# Patient Record
Sex: Male | Born: 1955
Health system: Southern US, Community
[De-identification: ages and names within clinical notes are randomized; demographics above are authoritative.]

## PROBLEM LIST (undated history)

## (undated) DIAGNOSIS — N3289 Other specified disorders of bladder: Secondary | ICD-10-CM

## (undated) DIAGNOSIS — M1990 Unspecified osteoarthritis, unspecified site: Secondary | ICD-10-CM

## (undated) DIAGNOSIS — R7303 Prediabetes: Secondary | ICD-10-CM

## (undated) HISTORY — PX: ROTATOR CUFF REPAIR: SHX139

## (undated) HISTORY — PX: REPLACEMENT TOTAL KNEE BILATERAL: SUR1225

---

## 1998-11-14 HISTORY — PX: HERNIA REPAIR: SHX51

## 2003-01-14 ENCOUNTER — Encounter: Payer: Self-pay | Admitting: Neurosurgery

## 2003-01-15 ENCOUNTER — Encounter: Payer: Self-pay | Admitting: Neurosurgery

## 2003-01-15 ENCOUNTER — Ambulatory Visit (HOSPITAL_COMMUNITY): Admission: RE | Admit: 2003-01-15 | Discharge: 2003-01-16 | Payer: Self-pay | Admitting: Neurosurgery

## 2009-11-14 HISTORY — PX: CERVICAL SPINE SURGERY: SHX589

## 2013-12-30 ENCOUNTER — Ambulatory Visit (INDEPENDENT_AMBULATORY_CARE_PROVIDER_SITE_OTHER): Payer: BC Managed Care – PPO

## 2013-12-30 ENCOUNTER — Encounter (INDEPENDENT_AMBULATORY_CARE_PROVIDER_SITE_OTHER): Payer: Self-pay

## 2013-12-30 ENCOUNTER — Encounter: Payer: Self-pay | Admitting: Family Medicine

## 2013-12-30 ENCOUNTER — Ambulatory Visit (INDEPENDENT_AMBULATORY_CARE_PROVIDER_SITE_OTHER): Payer: BC Managed Care – PPO | Admitting: Family Medicine

## 2013-12-30 VITALS — BP 137/81 | HR 75 | Temp 101.2°F | Wt 248.6 lb

## 2013-12-30 DIAGNOSIS — J09X2 Influenza due to identified novel influenza A virus with other respiratory manifestations: Secondary | ICD-10-CM

## 2013-12-30 DIAGNOSIS — R509 Fever, unspecified: Secondary | ICD-10-CM

## 2013-12-30 DIAGNOSIS — J209 Acute bronchitis, unspecified: Secondary | ICD-10-CM

## 2013-12-30 DIAGNOSIS — R059 Cough, unspecified: Secondary | ICD-10-CM

## 2013-12-30 DIAGNOSIS — R05 Cough: Secondary | ICD-10-CM

## 2013-12-30 LAB — POCT INFLUENZA A/B
Influenza A, POC: POSITIVE
Influenza B, POC: NEGATIVE

## 2013-12-30 MED ORDER — AZITHROMYCIN 250 MG PO TABS: ORAL_TABLET | ORAL | Status: DC

## 2013-12-30 MED ORDER — BENZONATATE 200 MG PO CAPS: 200.0000 mg | ORAL_CAPSULE | Freq: Three times a day (TID) | ORAL | Status: DC | PRN

## 2013-12-30 MED ORDER — OSELTAMIVIR PHOSPHATE 75 MG PO CAPS: 75.0000 mg | ORAL_CAPSULE | Freq: Two times a day (BID) | ORAL | Status: DC

## 2013-12-30 NOTE — Patient Instructions (Signed)
Acute Bronchitis Bronchitis is inflammation of the airways that extend from the windpipe into the lungs (bronchi). The inflammation often causes mucus to develop. This leads to a cough, which is the most common symptom of bronchitis.  In acute bronchitis, the condition usually develops suddenly and goes away over time, usually in a couple weeks. Smoking, allergies, and asthma can make bronchitis worse. Repeated episodes of bronchitis may cause further lung problems.  CAUSES Acute bronchitis is most often caused by the same virus that causes a cold. The virus can spread from person to person (contagious).  SIGNS AND SYMPTOMS   Cough.   Fever.   Coughing up mucus.   Body aches.   Chest congestion.   Chills.   Shortness of breath.   Sore throat.  DIAGNOSIS  Acute bronchitis is usually diagnosed through a physical exam. Tests, such as chest X-rays, are sometimes done to rule out other conditions.  TREATMENT  Acute bronchitis usually goes away in a couple weeks. Often times, no medical treatment is necessary. Medicines are sometimes given for relief of fever or cough. Antibiotics are usually not needed but may be prescribed in certain situations. In some cases, an inhaler may be recommended to help reduce shortness of breath and control the cough. A cool mist vaporizer may also be used to help thin bronchial secretions and make it easier to clear the chest.  HOME CARE INSTRUCTIONS  Get plenty of rest.   Drink enough fluids to keep your urine clear or pale yellow (unless you have a medical condition that requires fluid restriction). Increasing fluids may help thin your secretions and will prevent dehydration.   Only take over-the-counter or prescription medicines as directed by your health care provider.   Avoid smoking and secondhand smoke. Exposure to cigarette smoke or irritating chemicals will make bronchitis worse. If you are a smoker, consider using nicotine gum or skin  patches to help control withdrawal symptoms. Quitting smoking will help your lungs heal faster.   Reduce the chances of another bout of acute bronchitis by washing your hands frequently, avoiding people with cold symptoms, and trying not to touch your hands to your mouth, nose, or eyes.   Follow up with your health care provider as directed.  SEEK MEDICAL CARE IF: Your symptoms do not improve after 1 week of treatment.  SEEK IMMEDIATE MEDICAL CARE IF:  You develop an increased fever or chills.   You have chest pain.   You have severe shortness of breath.  You have bloody sputum.   You develop dehydration.  You develop fainting.  You develop repeated vomiting.  You develop a severe headache. MAKE SURE YOU:   Understand these instructions.  Will watch your condition.  Will get help right away if you are not doing well or get worse. Document Released: 12/08/2004 Document Revised: 07/03/2013 Document Reviewed: 04/23/2013 [facility] Patient Information 2014 [place].   Influenza A (H1N1) H1N1 formerly called "swine flu" is a new influenza virus causing sickness in people. The H1N1 virus is different from seasonal influenza viruses. However, the H1N1 symptoms are similar to seasonal influenza and it is spread from person to person. You may be at higher risk for serious problems if you have underlying serious medical conditions. The CDC and the [facility] are following reported cases around the world. CAUSES   The flu is thought to spread mainly person-to-person through coughing or sneezing of infected people.  A person may become infected by touching something with the  virus on it and then touching their mouth or nose. SYMPTOMS   Fever.  Headache.  Tiredness.  Cough.  Sore throat.  Runny or stuffy nose.  Body aches.  Diarrhea and vomiting These symptoms are referred to as "flu-like symptoms." A lot of different illnesses, including the  common cold, may have similar symptoms. DIAGNOSIS   There are tests that can tell if you have the H1N1 virus.  Confirmed cases of H1N1 will be reported to the state or local health department.  A doctor's exam may be needed to tell whether you have an infection that is a complication of the flu. HOME CARE INSTRUCTIONS   Stay informed. Visit the [facility] website for current recommendations. Visit [employer]. You may also call 1-800-CDC-INFO 484 069 0136).  Get help early if you develop any of the above symptoms.  If you are at high risk from complications of the flu, talk to your caregiver as soon as you develop flu-like symptoms. Those at higher risk for complications include:  People 65 years or older.  People with chronic medical conditions.  Pregnant women.  Young children.  Your caregiver may recommend antiviral medicine to help treat the flu.  If you get the flu, get plenty of rest, drink enough water and fluids to keep your urine clear or pale yellow, and avoid using alcohol or tobacco.  You may take over-the-counter medicine to relieve the symptoms of the flu if your caregiver approves. (Never give aspirin to children or teenagers who have flu-like symptoms, particularly fever). TREATMENT  If you do get sick, antiviral drugs are available. These drugs can make your illness milder and make you feel better faster. Treatment should start soon after illness starts. It is only effective if taken within the first day of becoming ill. Only your caregiver can prescribe antiviral medication.  PREVENTION   Cover your nose and mouth with a tissue or your arm when you cough or sneeze. Throw the tissue away.  Wash your hands often with soap and warm water, especially after you cough or sneeze. Alcohol-based cleaners are also effective against germs.  Avoid touching your eyes, nose or mouth. This is one way germs spread.  Try to avoid contact with sick people. Follow public  health advice regarding school closures. Avoid crowds.  Stay home if you get sick. Limit contact with others to keep from infecting them. People infected with the H1N1 virus may be able to infect others anywhere from 1 day before feeling sick to 5-7 days after getting flu symptoms.  An H1N1 vaccine is available to help protect against the virus. In addition to the H1N1 vaccine, you will need to be vaccinated for seasonal influenza. The H1N1 and seasonal vaccines may be given on the same day. The CDC especially recommends the H1N1 vaccine for:  Pregnant women.  People who live with or care for children younger than 35 months of age.  Health care and emergency services personnel.  Persons between the ages of 53 months through 44 years of age.  People from ages 37 through 55 years who are at higher risk for H1N1 because of chronic health disorders or immune system problems. FACEMASKS In community and home settings, the use of facemasks and N95 respirators are not normally recommended. In certain circumstances, a facemask or N95 respirator may be used for persons at increased risk of severe illness from influenza. Your caregiver can give additional recommendations for facemask use. IN CHILDREN, EMERGENCY WARNING SIGNS THAT NEED URGENT MEDICAL CARE:  Fast breathing or trouble breathing.  Bluish skin color.  Not drinking enough fluids.  Not waking up or not interacting normally.  Being so fussy that the child does not want to be held.  Your child has an oral temperature above 102 F (38.9 C), not controlled by medicine.  Your baby is older than 3 months with a rectal temperature of 102 F (38.9 C) or higher.  Your baby is 85 months old or younger with a rectal temperature of 100.4 F (38 C) or higher.  Flu-like symptoms improve but then return with fever and worse cough. IN ADULTS, EMERGENCY WARNING SIGNS THAT NEED URGENT MEDICAL CARE:  Difficulty breathing or shortness of  breath.  Pain or pressure in the chest or abdomen.  Sudden dizziness.  Confusion.  Severe or persistent vomiting.  Bluish color.  You have a oral temperature above 102 F (38.9 C), not controlled by medicine.  Flu-like symptoms improve but return with fever and worse cough. SEEK IMMEDIATE MEDICAL CARE IF:  You or someone you know is experiencing any of the above symptoms. When you arrive at the emergency center, report that you think you have the flu. You may be asked to wear a mask and/or sit in a secluded area to protect others from getting sick. MAKE SURE YOU:   Understand these instructions.  Will watch your condition.  Will get help right away if you are not doing well or get worse. Some of this information courtesy of the CDC.  Document Released: 04/18/2008 Document Revised: 01/23/2012 Document Reviewed: 04/18/2008 [facility] Patient Information 2014 [place], [place].        Dr Paula Libra Recommendations  For nutrition information, I recommend books:  1).Eat to Live by Dr Excell Seltzer. 2).Prevent and Reverse Heart Disease by Dr Karl Luke. 3) Dr Janene Harvey Book:  Program to Reverse Diabetes  Exercise recommendations are:  If unable to walk, then the patient can exercise in a chair 3 times a day. By flapping arms like a bird gently and raising legs outwards to the front.  If ambulatory, the patient can go for walks for 30 minutes 3 times a week. Then increase the intensity and duration as tolerated.  Goal is to try to attain exercise frequency to 5 times a week.  If applicable: Best to perform resistance exercises (machines or weights) 2 days a week and cardio type exercises 3 days per week.

## 2013-12-30 NOTE — Progress Notes (Signed)
Patient ID: Marvin Clarke, male   DOB: [birth year], 58 y.o.   MRN: [id number] SUBJECTIVE: CC: Chief Complaint  Patient presents with  . Acute Visit    cough fever had knee arthroscopy left knee on friday at [facility]     HPI: Had arthroscopy last Friday 3 days ago. Started having fever. When he coughs it hurts on the left side of the chest of the chest. Wheezes : some. Whenever he gets sick it goes to his chest. No past medical history on file. No past surgical history on file. History   Social History  . Marital Status: Married    Spouse Name: N/A    Number of Children: N/A  . Years of Education: N/A   Occupational History  . Not on file.   Social History Main Topics  . Smoking status: Never Smoker   . Smokeless tobacco: Not on file  . Alcohol Use: Not on file  . Drug Use: Not on file  . Sexual Activity: Not on file   Other Topics Concern  . Not on file   Social History Narrative  . No narrative on file   No family history on file. No current outpatient prescriptions on file prior to visit.   No current facility-administered medications on file prior to visit.   No Known Allergies  There is no immunization history on file for this patient. Prior to Admission medications   Not on File     ROS: As above in the HPI. All other systems are stable or negative.  OBJECTIVE: APPEARANCE:  Patient in no acute distress.The patient appeared well nourished and normally developed. Acyanotic. Waist: VITAL SIGNS:BP 137/81  Pulse 75  Temp(Src) 101.2 F (38.4 C) (Oral)  Wt 248 lb 9.6 oz (112.764 kg)  SpO2 96% Obese WM   SKIN: warm and  Dry without overt rashes, tattoos and scars  HEAD and Neck: without JVD, Head and scalp: normal Eyes:No scleral icterus. Fundi normal, eye movements normal. Ears: Auricle normal, canal normal, Tympanic membranes normal, insufflation normal. Nose: nasal congestion. Profuse rhinorrhea Throat: red Neck & thyroid: normal  CHEST &  LUNGS: Chest wall: normal Lungs: Rhonchi bilaterally. No wheezes. No rales.  CVS: Reveals the PMI to be normally located. Regular rhythm, First and Second Heart sounds are normal,  absence of murmurs, rubs or gallops. Peripheral vasculature: Radial pulses: normal Dorsal pedis pulses: normal Posterior pulses: normal  ABDOMEN:  Appearance: Obese Benign, no organomegaly, no masses, no Abdominal Aortic enlargement. No Guarding , no rebound. No Bruits. Bowel sounds: normal  RECTAL: N/A GU: N/A  EXTREMETIES: nonedematous.  MUSCULOSKELETAL:  Spine: normal Joints: left knee post arthroscopy  NEUROLOGIC: oriented to time,place and person; nonfocal. Cranial Nerves are normal.  ASSESSMENT: Fever - Plan: Influenza A/B, DG Chest 2 View  Influenza due to identified novel influenza A virus with other respiratory manifestations - Plan: oseltamivir (TAMIFLU) 75 MG capsule  Acute bronchitis - Plan: azithromycin (ZITHROMAX Z-PAK) 250 MG tablet, benzonatate (TESSALON) 200 MG capsule  PLAN:  Orders Placed This Encounter  Procedures  . DG Chest 2 View    Standing Status: Future     Number of Occurrences: 1     Standing Expiration Date: 02/28/2015    Order Specific Question:  Reason for Exam (SYMPTOM  OR DIAGNOSIS REQUIRED)    Answer:  cough    Order Specific Question:  Preferred imaging location?    Answer:  Internal  . Influenza A/B  [facility] reading (PRIMARY) by  Dr. Jacelyn Grip:  no acute findings.  Results for orders placed in visit on 12/30/13  POCT INFLUENZA A/B      Result Value Ref Range   Influenza A, POC Positive     Influenza B, POC Negative                                     Meds ordered this encounter  Medications  . aspirin 81 MG tablet    Sig: Take 81 mg by mouth daily.  Marland Kitchen docusate sodium (COLACE) 100 MG capsule    Sig: Take 100 mg by mouth 2 (two) times daily.  Marland Kitchen enoxaparin (LOVENOX) 40 MG/0.4ML injection    Sig: Inject 40 mg into the skin daily.  . ondansetron  (ZOFRAN-ODT) 4 MG disintegrating tablet    Sig: Take 4 mg by mouth every 8 (eight) hours as needed for nausea or vomiting.  Marland Kitchen oxyCODONE-acetaminophen (PERCOCET/ROXICET) 5-325 MG per tablet    Sig: Take 1 tablet by mouth every 4 (four) hours as needed for severe pain.  Marland Kitchen sulfamethoxazole-trimethoprim (BACTRIM,SEPTRA) 400-80 MG per tablet    Sig: Take 1 tablet by mouth 2 (two) times daily.  Marland Kitchen omeprazole (PRILOSEC) 20 MG capsule    Sig: Take 20 mg by mouth daily.  Marland Kitchen etanercept (ENBREL) 25 MG injection    Sig: Inject 25 mg into the skin 2 (two) times a week. Hold now to restart in March  . azithromycin (ZITHROMAX Z-PAK) 250 MG tablet    Sig: 2 tabs on day 1 then 1 tab on days 2 to 5    Dispense:  6 each    Refill:  0  . oseltamivir (TAMIFLU) 75 MG capsule    Sig: Take 1 capsule (75 mg total) by mouth 2 (two) times daily.    Dispense:  10 capsule    Refill:  0  . benzonatate (TESSALON) 200 MG capsule    Sig: Take 1 capsule (200 mg total) by mouth 3 (three) times daily as needed for cough.    Dispense:  30 capsule    Refill:  1   There are no discontinued medications. Return in about 3 days (around 01/02/2014) for recheck lungs.  Dawnette Mione P. Jacelyn Grip, M.D.

## 2014-01-02 ENCOUNTER — Ambulatory Visit: Payer: BC Managed Care – PPO | Admitting: Family Medicine

## 2014-01-02 ENCOUNTER — Encounter: Payer: Self-pay | Admitting: Family Medicine

## 2014-01-02 ENCOUNTER — Ambulatory Visit (INDEPENDENT_AMBULATORY_CARE_PROVIDER_SITE_OTHER): Payer: BC Managed Care – PPO | Admitting: Family Medicine

## 2014-01-02 VITALS — BP 123/82 | HR 80 | Temp 97.4°F | Ht 71.0 in | Wt 244.6 lb

## 2014-01-02 DIAGNOSIS — J111 Influenza due to unidentified influenza virus with other respiratory manifestations: Secondary | ICD-10-CM | POA: Insufficient documentation

## 2014-01-02 DIAGNOSIS — L409 Psoriasis, unspecified: Secondary | ICD-10-CM | POA: Insufficient documentation

## 2014-01-02 DIAGNOSIS — J209 Acute bronchitis, unspecified: Secondary | ICD-10-CM

## 2014-01-02 DIAGNOSIS — L408 Other psoriasis: Secondary | ICD-10-CM

## 2014-01-02 NOTE — Progress Notes (Signed)
Patient ID: Marvin Clarke, male   DOB: February 06, 1956, 58 y.o.   MRN: [id number] SUBJECTIVE: CC: Chief Complaint  Patient presents with  . Follow-up    reck chest congestion     HPI: Here for follow up of the influenza and acute bronchitis. A lot better. Fever broke and breathing better with less cough. No wheezing , no SOB, no Chest pain.   No past medical history on file. No past surgical history on file. History   Social History  . Marital Status: Married    Spouse Name: N/A    Number of Children: N/A  . Years of Education: N/A   Occupational History  . Not on file.   Social History Main Topics  . Smoking status: Never Smoker   . Smokeless tobacco: Not on file  . Alcohol Use: Not on file  . Drug Use: Not on file  . Sexual Activity: Not on file   Other Topics Concern  . Not on file   Social History Narrative  . No narrative on file   No family history on file. Current Outpatient Prescriptions on File Prior to Visit  Medication Sig Dispense Refill  . benzonatate (TESSALON) 200 MG capsule Take 1 capsule (200 mg total) by mouth 3 (three) times daily as needed for cough.  30 capsule  1  . aspirin 81 MG tablet Take 81 mg by mouth daily.      Marland Kitchen docusate sodium (COLACE) 100 MG capsule Take 100 mg by mouth 2 (two) times daily.      Marland Kitchen enoxaparin (LOVENOX) 40 MG/0.4ML injection Inject 40 mg into the skin daily.      Marland Kitchen etanercept (ENBREL) 25 MG injection Inject 25 mg into the skin 2 (two) times a week. Hold now to restart in March      . omeprazole (PRILOSEC) 20 MG capsule Take 20 mg by mouth daily.      . ondansetron (ZOFRAN-ODT) 4 MG disintegrating tablet Take 4 mg by mouth every 8 (eight) hours as needed for nausea or vomiting.      Marland Kitchen oseltamivir (TAMIFLU) 75 MG capsule Take 1 capsule (75 mg total) by mouth 2 (two) times daily.  10 capsule  0  . oxyCODONE-acetaminophen (PERCOCET/ROXICET) 5-325 MG per tablet Take 1 tablet by mouth every 4 (four) hours as needed for severe pain.       Marland Kitchen sulfamethoxazole-trimethoprim (BACTRIM,SEPTRA) 400-80 MG per tablet Take 1 tablet by mouth 2 (two) times daily.       No current facility-administered medications on file prior to visit.   No Known Allergies  There is no immunization history on file for this patient. Prior to Admission medications   Medication Sig Start Date End Date Taking? Authorizing Provider  benzonatate (TESSALON) 200 MG capsule Take 1 capsule (200 mg total) by mouth 3 (three) times daily as needed for cough. 12/30/13  Yes Vernie Shanks, MD  aspirin 81 MG tablet Take 81 mg by mouth daily.    Historical Provider, MD  clobetasol cream (TEMOVATE) 0.05 %  11/05/13   Historical Provider, MD  docusate sodium (COLACE) 100 MG capsule Take 100 mg by mouth 2 (two) times daily.    Historical Provider, MD  enoxaparin (LOVENOX) 40 MG/0.4ML injection Inject 40 mg into the skin daily.    Historical Provider, MD  etanercept (ENBREL) 25 MG injection Inject 25 mg into the skin 2 (two) times a week. Hold now to restart in March    Historical Provider, MD  omeprazole (PRILOSEC) 20 MG capsule Take 20 mg by mouth daily.    Historical Provider, MD  ondansetron (ZOFRAN-ODT) 4 MG disintegrating tablet Take 4 mg by mouth every 8 (eight) hours as needed for nausea or vomiting.    Historical Provider, MD  oseltamivir (TAMIFLU) 75 MG capsule Take 1 capsule (75 mg total) by mouth 2 (two) times daily. 12/30/13   Vernie Shanks, MD  oxyCODONE-acetaminophen (PERCOCET/ROXICET) 5-325 MG per tablet Take 1 tablet by mouth every 4 (four) hours as needed for severe pain.    Historical Provider, MD  sulfamethoxazole-trimethoprim (BACTRIM,SEPTRA) 400-80 MG per tablet Take 1 tablet by mouth 2 (two) times daily.    Historical Provider, MD     ROS: As above in the HPI. All other systems are stable or negative.  OBJECTIVE: APPEARANCE:  Patient in no acute distress.The patient appeared well nourished and normally developed. Acyanotic. Waist: VITAL  SIGNS:BP 123/82  Pulse 80  Temp(Src) 97.4 F (36.3 C) (Oral)  Ht 5\' 11"  (1.803 m)  Wt 244 lb 9.6 oz (110.95 kg)  BMI 34.13 kg/m2  SpO2 98% WM, looks better  SKIN: warm and  Dry without  tattoos and scars. He has marked psoriasis on his trunk.  HEAD and Neck: without JVD, Head and scalp: normal Eyes:No scleral icterus. Fundi normal, eye movements normal. Ears: Auricle normal, canal normal, Tympanic membranes normal, insufflation normal. Nose: nasal congestion. Throat: normal Neck & thyroid: normal  CHEST & LUNGS: Chest wall: normal Lungs: Coarse breath sounds.no Rales.  CVS: Reveals the PMI to be normally located. Regular rhythm, First and Second Heart sounds are normal,  absence of murmurs, rubs or gallops. Peripheral vasculature: Radial pulses: normal Dorsal pedis pulses: normal Posterior pulses: normal  ABDOMEN:  Appearance: obese Benign, no organomegaly, no masses, no Abdominal Aortic enlargement. No Guarding , no rebound. No Bruits. Bowel sounds: normal  RECTAL: N/A GU: N/A  EXTREMETIES: nonedematous.   NEUROLOGIC: oriented to time,place and person; nonfocal.  Results for orders placed in visit on 12/30/13  POCT INFLUENZA A/B      Result Value Ref Range   Influenza A, POC Positive     Influenza B, POC Negative      ASSESSMENT: Influenza  Acute bronchitis  Psoriasis  Respiratory illness improving.  PLAN: Continue medications. Contagious precautions. Fluids rest.  No orders of the defined types were placed in this encounter.   Meds ordered this encounter  Medications  . clobetasol cream (TEMOVATE) 0.05 %    Sig:   . DISCONTD: ondansetron (ZOFRAN) 4 MG tablet    Sig:    Medications Discontinued During This Encounter  Medication Reason  . azithromycin (ZITHROMAX Z-PAK) 250 MG tablet Completed Course  . ondansetron (ZOFRAN) 4 MG tablet Duplicate   Return if symptoms worsen or fail to improve.  Tiney Zipper P. Jacelyn Grip, M.D.

## 2014-01-23 ENCOUNTER — Ambulatory Visit (INDEPENDENT_AMBULATORY_CARE_PROVIDER_SITE_OTHER): Payer: BC Managed Care – PPO | Admitting: Nurse Practitioner

## 2014-01-23 ENCOUNTER — Encounter: Payer: Self-pay | Admitting: Nurse Practitioner

## 2014-01-23 VITALS — BP 144/94 | HR 64 | Temp 96.3°F | Ht 70.0 in | Wt 252.0 lb

## 2014-01-23 DIAGNOSIS — J019 Acute sinusitis, unspecified: Secondary | ICD-10-CM

## 2014-01-23 DIAGNOSIS — J209 Acute bronchitis, unspecified: Secondary | ICD-10-CM

## 2014-01-23 MED ORDER — CEFPROZIL 500 MG PO TABS: 500.0000 mg | ORAL_TABLET | Freq: Two times a day (BID) | ORAL | Status: DC

## 2014-01-23 MED ORDER — HYDROCODONE-HOMATROPINE 5-1.5 MG/5ML PO SYRP
5.0000 mL | ORAL_SOLUTION | Freq: Three times a day (TID) | ORAL | Status: DC | PRN
Start: 2014-01-23 — End: 2016-08-24

## 2014-01-23 NOTE — Progress Notes (Signed)
Subjective:    Patient ID: Marvin Clarke, male    DOB: 1956/08/10, 58 y.o.   MRN: [id number]  HPI Patient presents today complaining of cough. Symptoms began over 1 week ago and include headache, congestion, SOB with exertion, wheezing, sinus pressure, sleep disturbance, and chest tenderness. Denies fever. Treatment includes tessalon pearls with some relief.    Review of Systems  Constitutional: Negative for fever.  HENT: Positive for congestion and sinus pressure. Negative for ear pain.   Respiratory: Positive for cough, shortness of breath (With exertion only) and wheezing.   Cardiovascular: Negative for chest pain.  Neurological: Positive for headaches.  All other systems reviewed and are negative.       Objective:   Physical Exam  Constitutional: He is oriented to person, place, and time. He appears well-developed and well-nourished.  HENT:  Right Ear: External ear normal.  Left Ear: External ear normal.  Nose: Right sinus exhibits no maxillary sinus tenderness and no frontal sinus tenderness. Left sinus exhibits no maxillary sinus tenderness and no frontal sinus tenderness.  Mouth/Throat: Oropharynx is clear and moist.  Eyes: Pupils are equal, round, and reactive to light.  Neck: Normal range of motion. Neck supple.  Cardiovascular: Normal rate, regular rhythm and normal heart sounds.   Pulmonary/Chest: Effort normal and breath sounds normal. He has no wheezes.  Cough   Lymphadenopathy:    He has no cervical adenopathy.  Neurological: He is alert and oriented to person, place, and time.  Skin: Skin is warm and dry.  Psychiatric: He has a normal mood and affect. His behavior is normal. Judgment normal.    BP 144/94  Pulse 64  Temp(Src) 96.3 F (35.7 C) (Oral)  Ht 5\' 10"  (1.778 m)  Wt 252 lb (114.306 kg)  BMI 36.16 kg/m2       Assessment & Plan:   1. Sinusitis, acute   2. Acute bronchitis    No orders of the defined types were placed in this encounter.     Meds ordered this encounter  Medications  . cefPROZIL (CEFZIL) 500 MG tablet    Sig: Take 1 tablet (500 mg total) by mouth 2 (two) times daily.    Dispense:  20 tablet    Refill:  1    Order Specific Question:  Supervising Provider    Answer:  Chipper Herb [[id number]]  . HYDROcodone-homatropine (HYCODAN) 5-1.5 MG/5ML syrup    Sig: Take 5 mLs by mouth every 8 (eight) hours as needed for cough.    Dispense:  120 mL    Refill:  0    Order Specific Question:  Supervising Provider    Answer:  Chipper Herb [[id number]]    1. Take meds as prescribed 2. Use a cool mist humidifier especially during the winter months and when heat has been humid. 3. Use saline nose sprays frequently 4. Saline irrigations of the nose can be very helpful if done frequently.  * 4X daily for 1 week*  * Use of a nettie pot can be helpful with this. Follow directions with this* 5. Drink plenty of fluids 6. Keep thermostat turn down low 7.For any cough or congestion  Use plain Mucinex- regular strength or max strength is fine   * Children- consult with Pharmacist for dosing 8. For fever or aces or pains- take tylenol or ibuprofen appropriate for age and weight.  * for fevers greater than 101 orally you may alternate ibuprofen and tylenol every  3 hours.  Mary-Margaret Hassell Done, FNP

## 2014-01-23 NOTE — Patient Instructions (Signed)

## 2014-04-17 ENCOUNTER — Telehealth: Payer: Self-pay | Admitting: Nurse Practitioner

## 2014-04-17 NOTE — Telephone Encounter (Signed)
appt given for 6/18 with mmm

## 2014-05-01 ENCOUNTER — Encounter: Payer: Self-pay | Admitting: Nurse Practitioner

## 2014-05-01 ENCOUNTER — Ambulatory Visit (INDEPENDENT_AMBULATORY_CARE_PROVIDER_SITE_OTHER): Payer: BC Managed Care – PPO | Admitting: Nurse Practitioner

## 2014-05-01 VITALS — BP 138/73 | HR 84 | Temp 98.4°F | Ht 70.0 in | Wt 255.6 lb

## 2014-05-01 DIAGNOSIS — R0989 Other specified symptoms and signs involving the circulatory and respiratory systems: Secondary | ICD-10-CM

## 2014-05-01 NOTE — Patient Instructions (Signed)

## 2014-05-01 NOTE — Progress Notes (Signed)
   Subjective:    Patient ID: Marvin Clarke, male    DOB: 1956-01-30, 58 y.o.   MRN: [id number]  HPI Patient had shoulder xray at Dr. Johnnye Sima office and she told him that she seen some plaque in his carotid artery and that he needed to see family doctor. Patient denies any problems- no dizziness, no weakness.    Review of Systems  Constitutional: Negative.   HENT: Negative.   Eyes: Negative.   Respiratory: Negative.   Cardiovascular: Negative.   Gastrointestinal: Negative.   Endocrine: Negative.   Genitourinary: Negative.   Musculoskeletal: Negative.   Skin: Negative.   Allergic/Immunologic: Negative.   Neurological: Negative.   Hematological: Negative.   Psychiatric/Behavioral: Negative.        Objective:   Physical Exam  Constitutional: He is oriented to person, place, and time. He appears well-developed and well-nourished.  Cardiovascular: Normal rate, regular rhythm and normal heart sounds.   Pulmonary/Chest: Effort normal and breath sounds normal.  Neurological: He is alert and oriented to person, place, and time.  Skin: Skin is warm and dry.  Psychiatric: He has a normal mood and affect. His behavior is normal. Judgment and thought content normal.   BP 138/73  Pulse 84  Temp(Src) 98.4 F (36.9 C) (Oral)  Ht $R'5\' 10"'uz$  (1.778 m)  Wt 255 lb 9.6 oz (115.939 kg)  BMI 36.67 kg/m2        Assessment & Plan:  1. Bruit of right carotid artery Will wait on labs and test results - CMP14+EGFR - NMR, lipoprofile - US Carotid Duplex Bilateral; Future  Mary-Margaret Hassell Done, FNP

## 2014-09-03 ENCOUNTER — Telehealth: Payer: Self-pay | Admitting: Family Medicine

## 2014-09-03 NOTE — Telephone Encounter (Signed)
Pt given appt tomorrow with mmm @ 1:30-Pt was advised to CB if pain worsened, n/v, fever occurred, pt voiced understanding

## 2014-09-04 ENCOUNTER — Ambulatory Visit (INDEPENDENT_AMBULATORY_CARE_PROVIDER_SITE_OTHER): Payer: BC Managed Care – PPO | Admitting: Nurse Practitioner

## 2014-09-04 ENCOUNTER — Encounter: Payer: Self-pay | Admitting: Nurse Practitioner

## 2014-09-04 ENCOUNTER — Ambulatory Visit (INDEPENDENT_AMBULATORY_CARE_PROVIDER_SITE_OTHER): Payer: BC Managed Care – PPO

## 2014-09-04 VITALS — BP 133/85 | HR 87 | Temp 97.9°F | Ht 70.0 in | Wt 233.8 lb

## 2014-09-04 DIAGNOSIS — R1030 Lower abdominal pain, unspecified: Secondary | ICD-10-CM

## 2014-09-04 DIAGNOSIS — R1031 Right lower quadrant pain: Secondary | ICD-10-CM

## 2014-09-04 LAB — POCT URINALYSIS DIPSTICK
Bilirubin, UA: NEGATIVE
GLUCOSE UA: NEGATIVE
Ketones, UA: NEGATIVE
LEUKOCYTES UA: NEGATIVE
Nitrite, UA: NEGATIVE
Protein, UA: NEGATIVE
Spec Grav, UA: 1.01
UROBILINOGEN UA: NEGATIVE
pH, UA: 5

## 2014-09-04 LAB — POCT UA - MICROSCOPIC ONLY
BACTERIA, U MICROSCOPIC: NEGATIVE
CRYSTALS, UR, HPF, POC: NEGATIVE
Casts, Ur, LPF, POC: NEGATIVE
WBC, UR, HPF, POC: NEGATIVE
Yeast, UA: NEGATIVE

## 2014-09-04 LAB — POCT CBC
GRANULOCYTE PERCENT: 75.7 % (ref 37–80)
HCT, POC: 42.1 % — AB (ref 43.5–53.7)
Hemoglobin: 13.9 g/dL — AB (ref 14.1–18.1)
LYMPH, POC: 1.2 (ref 0.6–3.4)
MCH, POC: 29.2 pg (ref 27–31.2)
MCHC: 33.1 g/dL (ref 31.8–35.4)
MCV: 88.2 fL (ref 80–97)
MPV: 8.4 fL (ref 0–99.8)
PLATELET COUNT, POC: 285 10*3/uL (ref 142–424)
POC GRANULOCYTE: 4.9 (ref 2–6.9)
POC LYMPH %: 18.9 % (ref 10–50)
RBC: 4.8 M/uL (ref 4.69–6.13)
RDW, POC: 13.6 %
WBC: 6.5 10*3/uL (ref 4.6–10.2)

## 2014-09-04 MED ORDER — NAPROXEN 500 MG PO TABS: 500.0000 mg | ORAL_TABLET | Freq: Two times a day (BID) | ORAL | Status: DC

## 2014-09-04 NOTE — Progress Notes (Signed)
   Subjective:    Patient ID: Marvin Clarke, male    DOB: October 18, 1956, 58 y.o.   MRN: [id number]  HPI Patient is here today complaining of right groin pain that started about 2 weeks ago. He reports that the pain is worse in the morning, pain is aggrevated by movement. He rates his pain a 3/10.  Laying still makes it better. He has not tried any treatment. He denies any dysuria, hematuria, denies any history of kidney stone. Denies constipation.    Review of Systems  HENT:       Neck brace.   Eyes: Negative.   Respiratory: Negative.   Cardiovascular: Negative.   Gastrointestinal: Negative.   Endocrine: Negative.   Genitourinary: Negative.   Musculoskeletal: Positive for neck pain. Back pain: lower back.   Allergic/Immunologic: Negative.   Neurological: Negative.   Hematological: Negative.   Psychiatric/Behavioral: Negative.   All other systems reviewed and are negative.      Objective:   Physical Exam  Constitutional: He is oriented to person, place, and time. He appears well-developed and well-nourished.  HENT:  Head: Normocephalic.  Eyes: Pupils are equal, round, and reactive to light.  Neck: Normal range of motion.  Cardiovascular: Normal rate and intact distal pulses.   Pulmonary/Chest: Effort normal.  Abdominal: Soft. There is tenderness (right groin tenderness. negative for rebound tenderness. No mass no erythema or edema.).  Genitourinary:  bil descened testicles- without tenderness to palpation Inguinal rings WNL  Musculoskeletal: Normal range of motion.  Neurological: He is alert and oriented to person, place, and time.  Skin: Skin is warm.  Psychiatric: He has a normal mood and affect.    BP 133/85  Pulse 87  Temp(Src) 97.9 F (36.6 C) (Oral)  Ht 5\' 10"  (1.778 m)  Wt 233 lb 12.8 oz (106.051 kg)  BMI 33.55 kg/m2        Assessment & Plan:   1. Abdominal pain, right lower quadrant   2. Right groin pain    Meds ordered this encounter  Medications  .  naproxen (NAPROSYN) 500 MG tablet    Sig: Take 1 tablet (500 mg total) by mouth 2 (two) times daily with a meal.    Dispense:  30 tablet    Refill:  0    Order Specific Question:  Supervising Provider    Answer:  Chipper Herb [[id number]]   No heavy lifting Heat or ice to area if helps RTO if not improving  Mary-Margaret Hassell Done, FNP

## 2014-09-12 ENCOUNTER — Encounter: Payer: Self-pay | Admitting: Nurse Practitioner

## 2014-09-12 ENCOUNTER — Telehealth: Payer: Self-pay | Admitting: Nurse Practitioner

## 2014-09-12 ENCOUNTER — Ambulatory Visit (INDEPENDENT_AMBULATORY_CARE_PROVIDER_SITE_OTHER): Payer: BC Managed Care – PPO | Admitting: Nurse Practitioner

## 2014-09-12 ENCOUNTER — Other Ambulatory Visit: Payer: Self-pay | Admitting: Nurse Practitioner

## 2014-09-12 VITALS — BP 134/86 | HR 90 | Temp 96.7°F | Ht 70.0 in | Wt 232.0 lb

## 2014-09-12 DIAGNOSIS — R1031 Right lower quadrant pain: Secondary | ICD-10-CM

## 2014-09-12 MED ORDER — CYCLOBENZAPRINE HCL 10 MG PO TABS: 10.0000 mg | ORAL_TABLET | Freq: Three times a day (TID) | ORAL | Status: DC | PRN

## 2014-09-12 NOTE — Progress Notes (Signed)
   Subjective:    Patient ID: Marvin Clarke, male    DOB: June 11, 1956, 58 y.o.   MRN: [id number]  HPI Patient in today c/o pain in his right side- started 3-4 weeks ago- worse in he mornings- rates pain 8/10  In AM and later on in day 5-6?10- getting out of bed increases pain- stirring around makes it a little better. Pain is constant and radiates around to back. Patient was seen 1 week ago with same complaint and all test were negative- pain has gotten worse since last seen.    Review of Systems  Constitutional: Negative.   HENT: Negative.   Respiratory: Negative.   Cardiovascular: Negative.   Gastrointestinal: Negative for nausea, vomiting, diarrhea and constipation.  Genitourinary: Negative.   Neurological: Negative.   Psychiatric/Behavioral: Negative.   All other systems reviewed and are negative.      Objective:   Physical Exam  Constitutional: He is oriented to person, place, and time. He appears well-developed and well-nourished.  Cardiovascular: Normal rate, regular rhythm and normal heart sounds.   Pulmonary/Chest: Effort normal and breath sounds normal.  Neurological: He is alert and oriented to person, place, and time.  Skin: Skin is warm and dry.  Psychiatric: He has a normal mood and affect. His behavior is normal. Judgment and thought content normal.    BP 134/86  Pulse 90  Temp(Src) 96.7 F (35.9 C) (Oral)  Ht 5\' 10"  (1.778 m)  Wt 232 lb (105.235 kg)  BMI 33.29 kg/m2       Assessment & Plan:   1. Right lower quadrant abdominal pain    Orders Placed This Encounter  Procedures  . CT Abdomen Pelvis W Contrast    Standing Status: Future     Number of Occurrences:      Standing Expiration Date: 12/14/2015    Order Specific Question:  Reason for Exam (SYMPTOM  OR DIAGNOSIS REQUIRED)    Answer:  abd pain    Order Specific Question:  Preferred imaging location?    Answer:  External     Comments:  [facility]   NPO until test is done Will talk once get  results  [place], FNP

## 2014-09-12 NOTE — Telephone Encounter (Signed)
appt made

## 2014-09-24 ENCOUNTER — Other Ambulatory Visit: Payer: Self-pay | Admitting: Nurse Practitioner

## 2014-11-11 ENCOUNTER — Ambulatory Visit: Payer: BC Managed Care – PPO | Attending: Adult Reconstructive Orthopaedic Surgery | Admitting: Physical Therapy

## 2014-11-11 DIAGNOSIS — M25562 Pain in left knee: Secondary | ICD-10-CM | POA: Insufficient documentation

## 2014-11-11 DIAGNOSIS — M25662 Stiffness of left knee, not elsewhere classified: Secondary | ICD-10-CM | POA: Diagnosis not present

## 2014-11-13 ENCOUNTER — Ambulatory Visit: Payer: BC Managed Care – PPO | Admitting: *Deleted

## 2014-11-13 ENCOUNTER — Other Ambulatory Visit: Payer: Self-pay | Admitting: Nurse Practitioner

## 2014-11-13 DIAGNOSIS — M25562 Pain in left knee: Secondary | ICD-10-CM | POA: Diagnosis not present

## 2014-11-13 MED ORDER — CLOBETASOL PROPIONATE 0.05 % EX CREA: TOPICAL_CREAM | Freq: Two times a day (BID) | CUTANEOUS | Status: DC

## 2014-11-13 NOTE — Telephone Encounter (Signed)
Is okay to call in this cream for this patient or refill it from the past.

## 2014-11-24 ENCOUNTER — Ambulatory Visit: Payer: BLUE CROSS/BLUE SHIELD | Attending: Adult Reconstructive Orthopaedic Surgery | Admitting: Physical Therapy

## 2014-11-24 DIAGNOSIS — M25562 Pain in left knee: Secondary | ICD-10-CM | POA: Diagnosis not present

## 2014-11-24 DIAGNOSIS — M25662 Stiffness of left knee, not elsewhere classified: Secondary | ICD-10-CM | POA: Insufficient documentation

## 2014-11-25 ENCOUNTER — Ambulatory Visit: Payer: BLUE CROSS/BLUE SHIELD | Admitting: *Deleted

## 2014-11-25 DIAGNOSIS — M25562 Pain in left knee: Secondary | ICD-10-CM | POA: Diagnosis not present

## 2014-12-02 ENCOUNTER — Ambulatory Visit: Payer: BLUE CROSS/BLUE SHIELD | Admitting: *Deleted

## 2014-12-02 DIAGNOSIS — M25562 Pain in left knee: Secondary | ICD-10-CM | POA: Diagnosis not present

## 2014-12-04 ENCOUNTER — Ambulatory Visit: Payer: BLUE CROSS/BLUE SHIELD | Admitting: *Deleted

## 2014-12-04 DIAGNOSIS — M25562 Pain in left knee: Secondary | ICD-10-CM | POA: Diagnosis not present

## 2014-12-09 ENCOUNTER — Ambulatory Visit: Payer: BLUE CROSS/BLUE SHIELD | Admitting: *Deleted

## 2014-12-09 DIAGNOSIS — M25562 Pain in left knee: Secondary | ICD-10-CM | POA: Diagnosis not present

## 2014-12-11 ENCOUNTER — Encounter: Payer: BLUE CROSS/BLUE SHIELD | Admitting: *Deleted

## 2015-07-24 ENCOUNTER — Encounter: Payer: Self-pay | Admitting: Nurse Practitioner

## 2015-11-15 HISTORY — PX: SPINAL FUSION: SHX223

## 2016-08-24 ENCOUNTER — Encounter: Payer: Self-pay | Admitting: Physician Assistant

## 2016-08-24 ENCOUNTER — Ambulatory Visit (INDEPENDENT_AMBULATORY_CARE_PROVIDER_SITE_OTHER): Payer: Self-pay | Admitting: Physician Assistant

## 2016-08-24 VITALS — BP 127/83 | HR 63 | Temp 97.7°F | Ht 70.0 in | Wt 263.2 lb

## 2016-08-24 DIAGNOSIS — J0110 Acute frontal sinusitis, unspecified: Secondary | ICD-10-CM

## 2016-08-24 DIAGNOSIS — R05 Cough: Secondary | ICD-10-CM | POA: Diagnosis not present

## 2016-08-24 DIAGNOSIS — G5602 Carpal tunnel syndrome, left upper limb: Secondary | ICD-10-CM

## 2016-08-24 DIAGNOSIS — R059 Cough, unspecified: Secondary | ICD-10-CM

## 2016-08-24 MED ORDER — HYDROCODONE-HOMATROPINE 5-1.5 MG/5ML PO SYRP: 5.0000 mL | ORAL_SOLUTION | Freq: Four times a day (QID) | ORAL | 0 refills | Status: DC | PRN

## 2016-08-24 MED ORDER — AZITHROMYCIN 250 MG PO TABS: ORAL_TABLET | ORAL | 0 refills | Status: DC

## 2016-08-24 MED ORDER — MELOXICAM 7.5 MG PO TABS: 7.5000 mg | ORAL_TABLET | Freq: Every day | ORAL | 6 refills | Status: DC

## 2016-08-24 NOTE — Patient Instructions (Signed)

## 2016-08-24 NOTE — Progress Notes (Signed)
BP 127/83   Pulse 63   Temp 97.7 F (36.5 C) (Oral)   Ht 5\' 10"  (1.778 m)   Wt 263 lb 3.2 oz (119.4 kg)   BMI 37.77 kg/m    Subjective:    Patient ID: Marvin Clarke, male    DOB: 26-Sep-1956, 60 y.o.   MRN: [id number]  HPI: Marvin Clarke is a 60 y.o. male presenting on 08/24/2016 for chest congestion; Cough; and Numbness (left hand. Patient states it is getting to where he can not even open a bottle of water )  Patient has had his cough and congestion for greater than 1 week. No over-the-counter medication is helping. He states he has productive cough that is green. He denies any wheezing. He states he has felt chilled. But he did not check his temperature. He denies seeing any blood when he coughs or blows his nose.  Patient also is having numbness of the midportion of his left hand. It is not radiate past the wrist. There is pain in the wrist. 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 He does not recall a specific injury to this area. When he wakes up in the mornings and sometimes is non-and he has to pull it up and then.  Relevant past medical, surgical, family and social history reviewed and updated as indicated. Allergies and medications reviewed and updated.  History reviewed. No pertinent past medical history.  History reviewed. No pertinent surgical history.  Review of Systems  Constitutional: Positive for fatigue. Negative for appetite change.  HENT: Positive for ear pain, hearing loss, sinus pressure and sore throat.   Eyes: Negative.  Negative for pain and visual disturbance.  Respiratory: Positive for shortness of breath and wheezing. Negative for cough and chest tightness.   Cardiovascular: Negative.  Negative for chest pain, palpitations and leg swelling.  Gastrointestinal: Negative.  Negative for abdominal pain, diarrhea, nausea and vomiting.  Endocrine: Negative.   Genitourinary: Negative.   Musculoskeletal: Positive for myalgias. Negative for back pain.  Skin:  Negative.  Negative for color change and rash.  Neurological: Positive for headaches. Negative for weakness and numbness.  Psychiatric/Behavioral: Negative.       Medication List       Accurate as of 08/24/16  9:45 AM. Always use your most recent med list.          azithromycin 250 MG tablet Commonly known as:  ZITHROMAX Z-PAK As directed   HYDROcodone-homatropine 5-1.5 MG/5ML syrup Commonly known as:  HYCODAN Take 5-10 mLs by mouth every 6 (six) hours as needed for cough.   meloxicam 7.5 MG tablet Commonly known as:  MOBIC Take 1 tablet (7.5 mg total) by mouth daily.   STELARA 90 MG/ML Sosy injection Generic drug:  ustekinumab          Objective:    BP 127/83   Pulse 63   Temp 97.7 F (36.5 C) (Oral)   Ht 5\' 10"  (1.778 m)   Wt 263 lb 3.2 oz (119.4 kg)   BMI 37.77 kg/m   No Known Allergies  Physical Exam  Constitutional: He is oriented to person, place, and time. He appears well-developed and well-nourished.  HENT:  Head: Normocephalic and atraumatic.  Right Ear: Tympanic membrane and external ear normal. No middle ear effusion.  Left Ear: Tympanic membrane and external ear normal.  No middle ear effusion.  Nose: Mucosal edema and rhinorrhea present. Right sinus exhibits no maxillary sinus tenderness. Left sinus exhibits no maxillary sinus  tenderness.  Mouth/Throat: Uvula is midline. Posterior oropharyngeal erythema present.  Eyes: Conjunctivae and EOM are normal. Pupils are equal, round, and reactive to light. Right eye exhibits no discharge. Left eye exhibits no discharge.  Neck: Normal range of motion.  Cardiovascular: Normal rate, regular rhythm and normal heart sounds.   Pulmonary/Chest: Effort normal and breath sounds normal. No respiratory distress. He has no wheezes.  Abdominal: Soft.  Musculoskeletal:       Left wrist: He exhibits decreased range of motion, tenderness and swelling.  Positive Phalen's and Tinel's sign  Lymphadenopathy:    He has  no cervical adenopathy.  Neurological: He is alert and oriented to person, place, and time.  Skin: Skin is warm and dry.  Psychiatric: He has a normal mood and affect.  Nursing note and vitals reviewed.       Assessment & Plan:   1. Acute frontal sinusitis, recurrence not specified - azithromycin (ZITHROMAX Z-PAK) 250 MG tablet; As directed  Dispense: 6 tablet; Refill: 0  2. Cough - HYDROcodone-homatropine (HYCODAN) 5-1.5 MG/5ML syrup; Take 5-10 mLs by mouth every 6 (six) hours as needed for cough.  Dispense: 240 mL; Refill: 0  3. Carpal tunnel syndrome of left wrist -wear wrist splint as much as possible - meloxicam (MOBIC) 7.5 MG tablet; Take 1 tablet (7.5 mg total) by mouth daily.  Dispense: 30 tablet; Refill: 6   Continue all other maintenance medications as listed above.  Follow up plan: No Follow-up on file.  No orders of the defined types were placed in this encounter.   Educational handout given for Carpal tunnel syndrome  Terald Sleeper PA-C [facility] [address]  [place], [place] [address] [phone]   08/24/2016, 9:45 AM

## 2017-10-02 ENCOUNTER — Encounter: Payer: Self-pay | Admitting: Nurse Practitioner

## 2017-10-02 ENCOUNTER — Ambulatory Visit (INDEPENDENT_AMBULATORY_CARE_PROVIDER_SITE_OTHER): Payer: BLUE CROSS/BLUE SHIELD

## 2017-10-02 ENCOUNTER — Ambulatory Visit: Payer: BLUE CROSS/BLUE SHIELD | Admitting: Nurse Practitioner

## 2017-10-02 VITALS — BP 123/77 | HR 71 | Temp 98.7°F | Ht 70.0 in | Wt 240.0 lb

## 2017-10-02 DIAGNOSIS — L409 Psoriasis, unspecified: Secondary | ICD-10-CM

## 2017-10-02 DIAGNOSIS — Z0000 Encounter for general adult medical examination without abnormal findings: Secondary | ICD-10-CM

## 2017-10-02 NOTE — Patient Instructions (Signed)

## 2017-10-02 NOTE — Progress Notes (Signed)
   Subjective:    Patient ID: Marvin Clarke, male    DOB: [birth year], 61 y.o.   MRN: [id number]  HPI Patient comes in today for check up. His only current medical problem is psoriasis. He is seeing rheumatologist and is currently on cosentyx . He is doing well. Has some pain and takes hydrocodone when he needs it. He is doing well without complaints.    Review of Systems  Constitutional: Negative for activity change and appetite change.  HENT: Negative.   Eyes: Negative for pain.  Respiratory: Negative for shortness of breath.   Cardiovascular: Negative for chest pain, palpitations and leg swelling.  Gastrointestinal: Negative for abdominal pain.  Endocrine: Negative for polydipsia.  Genitourinary: Negative.   Skin: Negative for rash.  Neurological: Negative for dizziness, weakness and headaches.  Hematological: Does not bruise/bleed easily.  Psychiatric/Behavioral: Negative.   All other systems reviewed and are negative.      Objective:   Physical Exam  Constitutional: He is oriented to person, place, and time. He appears well-developed and well-nourished.  HENT:  Head: Normocephalic.  Right Ear: External ear normal.  Left Ear: External ear normal.  Nose: Nose normal.  Mouth/Throat: Oropharynx is clear and moist.  Eyes: EOM are normal. Pupils are equal, round, and reactive to light.  Neck: Normal range of motion. Neck supple. No JVD present. No thyromegaly present.  Cardiovascular: Normal rate, regular rhythm, normal heart sounds and intact distal pulses. Exam reveals no gallop and no friction rub.  No murmur heard. Pulmonary/Chest: Effort normal and breath sounds normal. No respiratory distress. He has no wheezes. He has no rales. He exhibits no tenderness.  Abdominal: Soft. Bowel sounds are normal. He exhibits no mass. There is no tenderness.  Genitourinary: Prostate normal and penis normal.  Musculoskeletal: Normal range of motion. He exhibits no edema.  Lymphadenopathy:      He has no cervical adenopathy.  Neurological: He is alert and oriented to person, place, and time. No cranial nerve deficit.  Skin: Skin is warm and dry.  Psychiatric: He has a normal mood and affect. His behavior is normal. Judgment and thought content normal.    BP 123/77   Pulse 71   Temp 98.7 F (37.1 C) (Oral)   Ht '5\' 10"'$  (1.778 m)   Wt 240 lb (108.9 kg)   BMI 34.44 kg/m   Chest x ray- no acute findings-Preliminary reading by Ronnald Collum, FNP  [facility]  EKG- Kerry Hough, FNP     Assessment & Plan:  1. Psoriasis Keep follow up with rheumatologist - CMP14+EGFR - Lipid panel - PSA, total and free - CBC with Differential/Platelet  2. Annual physical exam - DG Chest 2 View; Future - EKG 12-Lead    Labs pending Health maintenance reviewed Diet and exercise encouraged Continue all meds Follow up  In 6 months   [place], FNP

## 2017-10-09 ENCOUNTER — Telehealth: Payer: Self-pay | Admitting: Nurse Practitioner

## 2017-10-09 NOTE — Telephone Encounter (Signed)
ntbs for appointment to discuss

## 2017-10-10 NOTE — Telephone Encounter (Signed)
Call to pt to schedule appt Per pt he has appt with psychiatry He will call to schedule appt after appt with psych

## 2017-12-15 ENCOUNTER — Ambulatory Visit (INDEPENDENT_AMBULATORY_CARE_PROVIDER_SITE_OTHER): Payer: BLUE CROSS/BLUE SHIELD | Admitting: Family Medicine

## 2017-12-15 ENCOUNTER — Encounter: Payer: Self-pay | Admitting: Family Medicine

## 2017-12-15 VITALS — BP 119/62 | HR 70 | Temp 98.7°F | Ht 70.0 in | Wt 219.2 lb

## 2017-12-15 DIAGNOSIS — M545 Low back pain, unspecified: Secondary | ICD-10-CM

## 2017-12-15 DIAGNOSIS — R350 Frequency of micturition: Secondary | ICD-10-CM

## 2017-12-15 LAB — URINALYSIS, COMPLETE
Bilirubin, UA: NEGATIVE
Glucose, UA: NEGATIVE
LEUKOCYTES UA: NEGATIVE
Nitrite, UA: NEGATIVE
PROTEIN UA: NEGATIVE
RBC, UA: NEGATIVE
Specific Gravity, UA: 1.02 (ref 1.005–1.030)
Urobilinogen, Ur: 0.2 mg/dL (ref 0.2–1.0)
pH, UA: 6 (ref 5.0–7.5)

## 2017-12-15 LAB — MICROSCOPIC EXAMINATION
BACTERIA UA: NONE SEEN
Epithelial Cells (non renal): NONE SEEN /hpf (ref 0–10)
RBC MICROSCOPIC, UA: NONE SEEN /HPF (ref 0–?)
RENAL EPITHEL UA: NONE SEEN /HPF
WBC, UA: NONE SEEN /hpf (ref 0–?)

## 2017-12-15 LAB — BAYER DCA HB A1C WAIVED: HB A1C (BAYER DCA - WAIVED): 6.1 % (ref <7.0)

## 2017-12-15 MED ORDER — METHYLPREDNISOLONE ACETATE 80 MG/ML IJ SUSP
80.0000 mg | Freq: Once | INTRAMUSCULAR | Status: AC
  Administered 2017-12-15: 80 mg via INTRAMUSCULAR

## 2017-12-15 NOTE — Patient Instructions (Addendum)
Great to see you!  I believe the pain that you are experiencing is actually due to the muscles and bones in your back.  We will also check into your frequent urination thoroughly with blood work and a urine culture.  For now given you an injection of cortisone to help with the pain.  Take Tylenol or ibuprofen as needed for pain and use heat as often as you need.  Please let us know if you are not getting better as expected

## 2017-12-15 NOTE — Progress Notes (Signed)
   HPI  Patient presents today here with frequent urination and right-sided low back pain.  Patient states he has had frequent urination for months. He also has slow stream, weak stream, and some difficulty urinating at times.  He also complains of right-sided low back pain that is been going on for about 5 days.  He states that it is right-sided and radiates down to his right groin at times. Its mostly proves with tramadol. He has not tried other pain medications.   PMH: Smoking status noted ROS: Per HPI  Objective: BP 119/62   Pulse 70   Temp 98.7 F (37.1 C) (Oral)   Ht _0  (1.778 m)   Wt 219 lb 3.2 oz (99.4 kg)   BMI 31.45 kg/m  Gen: NAD, alert, cooperative with exam HEENT: NCAT CV: RRR, good S1/S2, no murmur Resp: CTABL, no wheezes, non-labored Ext: No edema, warm Neuro: Alert and oriented, No gross deficits MSK Tenderness to palpation of right-sided paraspinal muscles in the lumbar area, no midline tenderness. DRE: Slightly enlarged prostate with no significant tenderness to palpation  Assessment and plan:  #Frequent urination Unclear etiology, however patient was previously diagnosed with diabetes at [facility], the highest blood sugar I can find in the chart is 199. A1c UA is reassuring Urine culture  Patient may benefit from Flomax long-term   #Right-sided low back pain Likely muscle spasm, worse in the morning. Given IM Depo-Medrol Over-the-counter medications and heat recommended Follow-up as needed  Orders Placed This Encounter  Procedures  . Urine Culture  . Urinalysis, Complete  . CBC with Differential/Platelet  . BMP8+EGFR  . Bayer DCA Hb A1c Waived    Meds ordered this encounter  Medications  . methylPREDNISolone acetate (DEPO-MEDROL) injection 80 mg    Laroy Apple, MD [facility] 12/15/2017, 10:30 AM

## 2017-12-16 ENCOUNTER — Encounter: Payer: Self-pay | Admitting: Family Medicine

## 2017-12-16 LAB — CBC WITH DIFFERENTIAL/PLATELET
BASOS: 1 %
Basophils Absolute: 0 10*3/uL (ref 0.0–0.2)
EOS (ABSOLUTE): 0.2 10*3/uL (ref 0.0–0.4)
EOS: 2 %
HEMATOCRIT: 44.3 % (ref 37.5–51.0)
Hemoglobin: 14.2 g/dL (ref 13.0–17.7)
Immature Grans (Abs): 0 10*3/uL (ref 0.0–0.1)
Immature Granulocytes: 0 %
LYMPHS ABS: 1.5 10*3/uL (ref 0.7–3.1)
Lymphs: 19 %
MCH: 28.9 pg (ref 26.6–33.0)
MCHC: 32.1 g/dL (ref 31.5–35.7)
MCV: 90 fL (ref 79–97)
MONOS ABS: 0.5 10*3/uL (ref 0.1–0.9)
Monocytes: 7 %
Neutrophils Absolute: 5.9 10*3/uL (ref 1.4–7.0)
Neutrophils: 71 %
Platelets: 278 10*3/uL (ref 150–379)
RBC: 4.92 x10E6/uL (ref 4.14–5.80)
RDW: 15.3 % (ref 12.3–15.4)
WBC: 8.1 10*3/uL (ref 3.4–10.8)

## 2017-12-16 LAB — BMP8+EGFR
BUN/Creatinine Ratio: 12 (ref 10–24)
BUN: 11 mg/dL (ref 8–27)
CO2: 22 mmol/L (ref 20–29)
Calcium: 9.2 mg/dL (ref 8.6–10.2)
Chloride: 103 mmol/L (ref 96–106)
Creatinine, Ser: 0.92 mg/dL (ref 0.76–1.27)
GFR calc Af Amer: 103 mL/min/1.73
GFR calc non Af Amer: 89 mL/min/1.73
Glucose: 106 mg/dL — ABNORMAL HIGH (ref 65–99)
Potassium: 4.4 mmol/L (ref 3.5–5.2)
Sodium: 142 mmol/L (ref 134–144)

## 2017-12-16 LAB — URINE CULTURE: Organism ID, Bacteria: NO GROWTH

## 2018-04-03 ENCOUNTER — Ambulatory Visit: Payer: BLUE CROSS/BLUE SHIELD | Admitting: Nurse Practitioner

## 2018-04-03 ENCOUNTER — Encounter: Payer: Self-pay | Admitting: Nurse Practitioner

## 2018-04-03 VITALS — BP 115/74 | HR 69 | Temp 99.1°F | Ht 70.0 in | Wt 228.0 lb

## 2018-04-03 DIAGNOSIS — Z1159 Encounter for screening for other viral diseases: Secondary | ICD-10-CM

## 2018-04-03 DIAGNOSIS — Z0000 Encounter for general adult medical examination without abnormal findings: Secondary | ICD-10-CM | POA: Diagnosis not present

## 2018-04-03 NOTE — Progress Notes (Signed)
   Subjective:    Patient ID: ZEEK ROSTRON, male    DOB: 1956/09/10, 62 y.o.   MRN: [id number]  HPI Patient comes in today for physical exam. His only current medical problem is psoriasis in which he sees rheumatologist for. He is on cosentyx injection monthly. Not having any problems. He says he got stung by a bee 2 months ago and he says that area is still itching daily. BP Readings from Last 3 Encounters:  04/03/18 115/74  12/15/17 119/62  10/02/17 123/77      Review of Systems  Constitutional: Negative for activity change and appetite change.  HENT: Negative.   Eyes: Negative for pain.  Respiratory: Negative for shortness of breath.   Cardiovascular: Negative for chest pain, palpitations and leg swelling.  Gastrointestinal: Negative for abdominal pain.  Endocrine: Negative for polydipsia.  Genitourinary: Negative.   Skin: Negative for rash.  Neurological: Negative for dizziness, weakness and headaches.  Hematological: Does not bruise/bleed easily.  Psychiatric/Behavioral: Negative.   All other systems reviewed and are negative.      Objective:   Physical Exam  Constitutional: He is oriented to person, place, and time.  HENT:  Head: Normocephalic.  Nose: Nose normal.  Mouth/Throat: Oropharynx is clear and moist.  Eyes: Pupils are equal, round, and reactive to light. EOM are normal.  Neck: Normal range of motion and phonation normal. Neck supple. No JVD present. Carotid bruit is not present. No thyroid mass and no thyromegaly present.  Cardiovascular: Normal rate and regular rhythm.  Pulmonary/Chest: Effort normal and breath sounds normal. No respiratory distress.  Abdominal: Soft. Normal appearance, normal aorta and bowel sounds are normal. There is no tenderness.  Musculoskeletal: Normal range of motion.  Lymphadenopathy:    He has no cervical adenopathy.  Neurological: He is alert and oriented to person, place, and time.  Skin: Skin is warm and dry.  1cm  erythematous annular papular lesion on right shoulder blade- no sign of infection.  Psychiatric: He has a normal mood and affect. His behavior is normal. Judgment and thought content normal.    BP 115/74   Pulse 69   Temp 99.1 F (37.3 C) (Oral)   Ht '5\' 10"'$  (1.778 m)   Wt 228 lb (103.4 kg)   BMI 32.71 kg/m        Assessment & Plan:  THEODORO KOVAL comes in today with chief complaint of Medical Management of Chronic Issues   Diagnosis and orders addressed:  1. Annual physical exam - CBC with Differential/Platelet - CMP14+EGFR - Lipid panel  2. Need for hepatitis C screening test - Hepatitis C antibody   Labs pending Health Maintenance reviewed Diet and exercise encouraged  Follow up plan: 1 year and prn   Mary-Margaret Hassell Done, FNP

## 2018-04-03 NOTE — Patient Instructions (Signed)
Bone Health Bones protect organs, store calcium, and anchor muscles. Good health habits, such as eating nutritious foods and exercising regularly, are important for maintaining healthy bones. They can also help to prevent a condition that causes bones to lose density and become weak and brittle (osteoporosis). Why is bone mass important? Bone mass refers to the amount of bone tissue that you have. The higher your bone mass, the stronger your bones. An important step toward having healthy bones throughout life is to have strong and dense bones during childhood. A young adult who has a high bone mass is more likely to have a high bone mass later in life. Bone mass at its greatest it is called peak bone mass. A large decline in bone mass occurs in older adults. In women, it occurs about the time of menopause. During this time, it is important to practice good health habits, because if more bone is lost than what is replaced, the bones will become less healthy and more likely to break (fracture). If you find that you have a low bone mass, you may be able to prevent osteoporosis or further bone loss by changing your diet and lifestyle. How can I find out if my bone mass is low? Bone mass can be measured with an X-ray test that is called a bone mineral density (BMD) test. This test is recommended for all women who are age 65 or older. It may also be recommended for men who are age 70 or older, or for people who are more likely to develop osteoporosis due to:  Having bones that break easily.  Having a long-term disease that weakens bones, such as kidney disease or rheumatoid arthritis.  Having menopause earlier than normal.  Taking medicine that weakens bones, such as steroids, thyroid hormones, or hormone treatment for breast cancer or prostate cancer.  Smoking.  Drinking three or more alcoholic drinks each day.  What are the nutritional recommendations for healthy bones? To have healthy bones, you  need to get enough of the right minerals and vitamins. Most nutrition experts recommend getting these nutrients from the foods that you eat. Nutritional recommendations vary from person to person. Ask your health care provider what is healthy for you. Here are some general guidelines. Calcium Recommendations Calcium is the most important (essential) mineral for bone health. Most people can get enough calcium from their diet, but supplements may be recommended for people who are at risk for osteoporosis. Good sources of calcium include:  Dairy products, such as low-fat or nonfat milk, cheese, and yogurt.  Dark green leafy vegetables, such as bok choy and broccoli.  Calcium-fortified foods, such as orange juice, cereal, bread, soy beverages, and tofu products.  Nuts, such as almonds.  Follow these recommended amounts for daily calcium intake:  Children, age 1?3: 700 mg.  Children, age 4?8: 1,000 mg.  Children, age 9?13: 1,300 mg.  Teens, age 14?18: 1,300 mg.  Adults, age 19?50: 1,000 mg.  Adults, age 51?70: ? Men: 1,000 mg. ? Women: 1,200 mg.  Adults, age 71 or older: 1,200 mg.  Pregnant and breastfeeding females: ? Teens: 1,300 mg. ? Adults: 1,000 mg.  Vitamin D Recommendations Vitamin D is the most essential vitamin for bone health. It helps the body to absorb calcium. Sunlight stimulates the skin to make vitamin D, so be sure to get enough sunlight. If you live in a cold climate or you do not get outside often, your health care provider may recommend that you take vitamin   D supplements. Good sources of vitamin D in your diet include:  Egg yolks.  Saltwater fish.  Milk and cereal fortified with vitamin D.  Follow these recommended amounts for daily vitamin D intake:  Children and teens, age 1?18: 600 international units.  Adults, age 50 or younger: 400-800 international units.  Adults, age 51 or older: 800-1,000 international units.  Other Nutrients Other nutrients  for bone health include:  Phosphorus. This mineral is found in meat, poultry, dairy foods, nuts, and legumes. The recommended daily intake for adult men and adult women is 700 mg.  Magnesium. This mineral is found in seeds, nuts, dark green vegetables, and legumes. The recommended daily intake for adult men is 400?420 mg. For adult women, it is 310?320 mg.  Vitamin K. This vitamin is found in green leafy vegetables. The recommended daily intake is 120 mg for adult men and 90 mg for adult women.  What type of physical activity is best for building and maintaining healthy bones? Weight-bearing and strength-building activities are important for building and maintaining peak bone mass. Weight-bearing activities cause muscles and bones to work against gravity. Strength-building activities increases muscle strength that supports bones. Weight-bearing and muscle-building activities include:  Walking and hiking.  Jogging and running.  Dancing.  Gym exercises.  Lifting weights.  Tennis and racquetball.  Climbing stairs.  Aerobics.  Adults should get at least 30 minutes of moderate physical activity on most days. Children should get at least 60 minutes of moderate physical activity on most days. Ask your health care provide what type of exercise is best for you. Where can I find more information? For more information, check out the following websites:  National Osteoporosis Foundation: http://nof.org/learn/basics  National Institutes of Health: http://www.niams.nih.gov/Health_Info/Bone/Bone_Health/bone_health_for_life.asp  This information is not intended to replace advice given to you by your health care provider. Make sure you discuss any questions you have with your health care provider. Document Released: 01/21/2004 Document Revised: 05/20/2016 Document Reviewed: 11/05/2014 Elsevier Interactive Patient Education  2018 Elsevier Inc.  

## 2018-04-04 LAB — CMP14+EGFR
A/G RATIO: 2.4 — AB (ref 1.2–2.2)
ALBUMIN: 4.5 g/dL (ref 3.6–4.8)
ALT: 10 IU/L (ref 0–44)
AST: 14 IU/L (ref 0–40)
Alkaline Phosphatase: 56 IU/L (ref 39–117)
BILIRUBIN TOTAL: 0.4 mg/dL (ref 0.0–1.2)
BUN / CREAT RATIO: 15 (ref 10–24)
BUN: 13 mg/dL (ref 8–27)
CHLORIDE: 102 mmol/L (ref 96–106)
CO2: 24 mmol/L (ref 20–29)
Calcium: 9.6 mg/dL (ref 8.6–10.2)
Creatinine, Ser: 0.86 mg/dL (ref 0.76–1.27)
GFR calc non Af Amer: 93 mL/min/{1.73_m2} (ref >59)
GFR, EST AFRICAN AMERICAN: 107 mL/min/{1.73_m2} (ref >59)
GLUCOSE: 106 mg/dL — AB (ref 65–99)
Globulin, Total: 1.9 g/dL (ref 1.5–4.5)
Potassium: 4.5 mmol/L (ref 3.5–5.2)
Sodium: 139 mmol/L (ref 134–144)
TOTAL PROTEIN: 6.4 g/dL (ref 6.0–8.5)

## 2018-04-04 LAB — LIPID PANEL
CHOL/HDL RATIO: 5.6 ratio — AB (ref 0.0–5.0)
Cholesterol, Total: 214 mg/dL — ABNORMAL HIGH (ref 100–199)
HDL: 38 mg/dL — ABNORMAL LOW (ref >39)
LDL Calculated: 124 mg/dL — ABNORMAL HIGH (ref 0–99)
Triglycerides: 260 mg/dL — ABNORMAL HIGH (ref 0–149)
VLDL CHOLESTEROL CAL: 52 mg/dL — AB (ref 5–40)

## 2018-04-04 LAB — CBC WITH DIFFERENTIAL/PLATELET
BASOS: 0 %
Basophils Absolute: 0 10*3/uL (ref 0.0–0.2)
EOS (ABSOLUTE): 0.1 10*3/uL (ref 0.0–0.4)
EOS: 2 %
HEMATOCRIT: 44.8 % (ref 37.5–51.0)
Hemoglobin: 14.7 g/dL (ref 13.0–17.7)
IMMATURE GRANS (ABS): 0 10*3/uL (ref 0.0–0.1)
IMMATURE GRANULOCYTES: 0 %
LYMPHS: 23 %
Lymphocytes Absolute: 1.5 10*3/uL (ref 0.7–3.1)
MCH: 29.1 pg (ref 26.6–33.0)
MCHC: 32.8 g/dL (ref 31.5–35.7)
MCV: 89 fL (ref 79–97)
MONOS ABS: 0.5 10*3/uL (ref 0.1–0.9)
Monocytes: 7 %
NEUTROS PCT: 68 %
Neutrophils Absolute: 4.2 10*3/uL (ref 1.4–7.0)
PLATELETS: 244 10*3/uL (ref 150–450)
RBC: 5.05 x10E6/uL (ref 4.14–5.80)
RDW: 15.6 % — AB (ref 12.3–15.4)
WBC: 6.3 10*3/uL (ref 3.4–10.8)

## 2018-04-04 LAB — HEPATITIS C ANTIBODY

## 2018-05-21 ENCOUNTER — Ambulatory Visit: Payer: BLUE CROSS/BLUE SHIELD | Admitting: Family Medicine

## 2018-05-21 ENCOUNTER — Encounter: Payer: Self-pay | Admitting: Family Medicine

## 2018-05-21 VITALS — BP 131/79 | HR 56 | Temp 98.3°F | Ht 70.0 in | Wt 232.0 lb

## 2018-05-21 DIAGNOSIS — M792 Neuralgia and neuritis, unspecified: Secondary | ICD-10-CM | POA: Diagnosis not present

## 2018-05-21 MED ORDER — PREDNISONE 20 MG PO TABS: ORAL_TABLET | ORAL | 0 refills | Status: DC

## 2018-05-21 MED ORDER — HYDROCODONE-ACETAMINOPHEN 5-325 MG PO TABS: 1.0000 | ORAL_TABLET | Freq: Four times a day (QID) | ORAL | 0 refills | Status: DC | PRN

## 2018-05-21 MED ORDER — BACLOFEN 10 MG PO TABS: 10.0000 mg | ORAL_TABLET | Freq: Three times a day (TID) | ORAL | 0 refills | Status: DC

## 2018-05-21 NOTE — Progress Notes (Signed)
BP 131/79   Pulse (!) 56   Temp 98.3 F (36.8 C) (Oral)   Ht 5\' 10"  (1.778 m)   Wt 232 lb (105.2 kg)   BMI 33.29 kg/m    Subjective:    Patient ID: Marvin Clarke, male    DOB: Jan 13, 1956, 62 y.o.   MRN: [id number]  HPI: Marvin Clarke is a 62 y.o. male presenting on 05/21/2018 for Pain in right shoulder radiating all the way down arm   HPI Right shoulder pain with radiating pain. Patient comes in with right shoulder pain and radiating pain going all the way down his arm to his fingers that has been going on for the past 3 or 4 weeks increased.  He does have known chronic neck history and has had 2 surgeries on his neck previously.  He says that the pain gets better when he puts his arm over his head on that is worse when he lets his arm hang dependent.  He says he has some tingling and numbness in his fingers on that right side.  Relevant past medical, surgical, family and social history reviewed and updated as indicated. Interim medical history since our last visit reviewed. Allergies and medications reviewed and updated.  Review of Systems  Constitutional: Negative for chills and fever.  Respiratory: Negative for shortness of breath and wheezing.   Cardiovascular: Negative for chest pain and leg swelling.  Musculoskeletal: Positive for arthralgias, myalgias and neck pain. Negative for back pain and gait problem.  Skin: Negative for rash.  Neurological: Positive for numbness. Negative for weakness.  All other systems reviewed and are negative.   Per HPI unless specifically indicated above   Allergies as of 05/21/2018   No Known Allergies     Medication List        Accurate as of 05/21/18  2:13 PM. Always use your most recent med list.          baclofen 10 MG tablet Commonly known as:  LIORESAL Take 1 tablet (10 mg total) by mouth 3 (three) times daily.   COSENTYX SENSOREADY 300 DOSE 150 MG/ML Soaj Generic drug:  Secukinumab   HYDROcodone-acetaminophen 5-325 MG  tablet Commonly known as:  NORCO Take 1 tablet by mouth every 6 (six) hours as needed for moderate pain.   predniSONE 20 MG tablet Commonly known as:  DELTASONE Take 3 tabs daily for 1 week, then 2 tabs daily for week 2, then 1 tab daily for week 3.          Objective:    BP 131/79   Pulse (!) 56   Temp 98.3 F (36.8 C) (Oral)   Ht 5\' 10"  (1.778 m)   Wt 232 lb (105.2 kg)   BMI 33.29 kg/m   Wt Readings from Last 3 Encounters:  05/21/18 232 lb (105.2 kg)  04/03/18 228 lb (103.4 kg)  12/15/17 219 lb 3.2 oz (99.4 kg)    Physical Exam  Constitutional: He is oriented to person, place, and time. He appears well-developed and well-nourished. No distress.  Eyes: Conjunctivae are normal. No scleral icterus.  Cardiovascular: Normal rate, regular rhythm, normal heart sounds and intact distal pulses.  No murmur heard. Pulmonary/Chest: Effort normal and breath sounds normal. No respiratory distress. He has no wheezes.  Musculoskeletal: Normal range of motion. He exhibits no edema.       Cervical back: He exhibits tenderness (Small amount of reproducible tenderness on the back of his neck, none noted in his shoulder.  Pain worse with cross body movement and behind his back, improved with overhead movement). He exhibits normal range of motion, no bony tenderness, no edema, no deformity and no laceration.  Neurological: He is alert and oriented to person, place, and time. Coordination normal.  Skin: Skin is warm and dry. No rash noted. He is not diaphoretic.  Psychiatric: He has a normal mood and affect. His behavior is normal.  Nursing note and vitals reviewed.       Assessment & Plan:   Problem List Items Addressed This Visit    None    Visit Diagnoses    Radicular pain in right arm    -  Primary   Relevant Medications   predniSONE (DELTASONE) 20 MG tablet   HYDROcodone-acetaminophen (NORCO) 5-325 MG tablet   baclofen (LIORESAL) 10 MG tablet      Patient has had 2 neck  surgeries previously, think this is related to his neck and radicular symptoms, if he continues to have problems despite the anti-inflammatories and muscle relaxer then recommend he go back to his spinal surgeon who was Dr. Hortencia Pilar at [facility].  He will call back if he needs a referral Follow up plan: Return if symptoms worsen or fail to improve.  Counseling provided for all of the vaccine components No orders of the defined types were placed in this encounter.   Caryl Pina, MD [facility] 05/21/2018, 2:13 PM

## 2018-05-23 ENCOUNTER — Telehealth: Payer: Self-pay | Admitting: Nurse Practitioner

## 2018-05-23 DIAGNOSIS — M792 Neuralgia and neuritis, unspecified: Secondary | ICD-10-CM

## 2018-05-23 NOTE — Telephone Encounter (Signed)
Radicular right arm pain diagnosis, go ahead and put in referral to spinal surgeon who was Dr. Hortencia Pilar

## 2018-05-23 NOTE — Telephone Encounter (Signed)
Patient was seen 7/8 and states he would like referral- please advise and place of approved.

## 2018-05-24 NOTE — Telephone Encounter (Signed)
Patient aware and referral place.

## 2018-06-12 ENCOUNTER — Encounter: Payer: Self-pay | Admitting: Physical Therapy

## 2018-06-12 ENCOUNTER — Ambulatory Visit: Payer: BLUE CROSS/BLUE SHIELD | Attending: Orthopaedic Surgery | Admitting: Physical Therapy

## 2018-06-12 ENCOUNTER — Other Ambulatory Visit: Payer: Self-pay

## 2018-06-12 DIAGNOSIS — M542 Cervicalgia: Secondary | ICD-10-CM | POA: Insufficient documentation

## 2018-06-12 DIAGNOSIS — R293 Abnormal posture: Secondary | ICD-10-CM | POA: Diagnosis present

## 2018-06-12 NOTE — Therapy (Signed)
[facility] [place], [place], [address] Phone: [phone]   Fax:  [phone]  Physical Therapy Evaluation  Patient Details  Name: Marvin Clarke MRN: [id number] Date of Birth: July 08, 1956 Referring Provider: Arsenio Katz MD   Encounter Date: 06/12/2018  PT End of Session - 06/12/18 1030    Visit Number  1    Number of Visits  12    Date for PT Re-Evaluation  07/24/18    PT Start Time  0900    PT Stop Time  0948    PT Time Calculation (min)  48 min    Activity Tolerance  Patient tolerated treatment well    Behavior During Therapy  [facility] for tasks assessed/performed       History reviewed. No pertinent past medical history.  History reviewed. No pertinent surgical history.  There were no vitals filed for this visit.   Subjective Assessment - 06/12/18 0934    Subjective  The patient has had 2 neck surgeries and has continued pain.  He reports more recently he lifted something very heavy and then began to experience pain from the right side of his neck doen the length of his right UE.  He reports it feels like his entire right UE hurts and this includes tingling in his hand.  He also experiences frequent headaches on the right side that radiates to his righ temporal area.  He is to receive an MRI soon.  He reports without medication his pain is a 7/10.  Moving is head, especially backward increases his pain greatly and not moving his head and medication decreases his pain.    Pertinent History  2 previous neck surgeries.    Patient Stated Goals  Reduce pain.    Currently in Pain?  Yes    Pain Score  7     Pain Location  Neck    Pain Orientation  Right    Pain Descriptors / Indicators  Aching;Sharp;Numbness;Tingling    Pain Type  Chronic pain    Pain Radiating Towards  Right UE.    Pain Onset  More than a month ago    Pain Frequency  Constant    Aggravating Factors   See above.    Pain Relieving Factors  See above.          [facility] PT Assessment - 06/12/18 0001      Assessment   Medical Diagnosis  Neck pain.    Referring Provider  Arsenio Katz MD    Onset Date/Surgical Date  -- Ongoing.    Hand Dominance  Right      Precautions   Precautions  -- Cervical fusion x 2 multi-level.      Restrictions   Weight Bearing Restrictions  No      Balance Screen   Has the patient fallen in the past 6 months  No    Has the patient had a decrease in activity level because of a fear of falling?   No    Is the patient reluctant to leave their home because of a fear of falling?   No      Home Environment   Living Environment  Private residence      Prior Function   Level of Independence  Independent      Posture/Postural Control   Posture/Postural Control  Postural limitations    Postural Limitations  Rounded Shoulders;Forward head      ROM / Strength   AROM / PROM /  Strength  AROM;Strength      AROM   Overall AROM Comments  Full active right shoulder flexion and ER= 70 degrees.  Active right cervical rotation= 42 degrees and left= 35 degrees.      Strength   Overall Strength Comments  Right deltoid strength= 4/5; right shoulder IR/ER= 4/5; right elbow extension and flexion and right wrist flexion and extension= 4/5.  Right grip strength= 42 pounds and left= 35 pounds.      Palpation   Palpation comment  Very tender to palpation over right suboccipital musculature, right UT and posterior cuff with increased tone in these muscle groupd.      Ambulation/Gait   Gait Comments  WNL.                Objective measurements completed on examination: See above findings.      [facility] Adult PT Treatment/Exercise - 06/12/18 0001      Modalities   Modalities  Electrical Stimulation;Moist Heat      Moist Heat Therapy   Number Minutes Moist Heat  15 Minutes    Moist Heat Location  -- Right cervical region.      Acupuncturist Location  Right suboccipital/UT/post  cuff.    Electrical Stimulation Action  IFC    Electrical Stimulation Parameters  80-150 Hz x 15 minutes.    Electrical Stimulation Goals  Tone;Pain               PT Short Term Goals - 06/12/18 1050      PT SHORT TERM GOAL #1   Title  STG's=LTG's.        PT Long Term Goals - 06/12/18 1051      PT LONG TERM GOAL #1   Title  Independent with a HEP.    Time  6    Period  Weeks    Status  New      PT LONG TERM GOAL #2   Title  Bilateral active cervical rotation to 55 degrees so he can turn his head more easily while driving.    Time  6    Period  Weeks    Status  New      PT LONG TERM GOAL #3   Title  Decrease headaches to no more than one per week.    Time  6    Period  Weeks    Status  New      PT LONG TERM GOAL #4   Title  Perform ADL's with pain not > 3-4/10.    Time  6    Period  Weeks    Status  New             Plan - 06/12/18 1032    Clinical Impression Statement  The patient presents to OPPT with c/o right sided neck pain and pain reported down the length of his right UE to his hands which tingles.  He also has headaches.  His cervical range of motion is very limited and he has diffuse right UE weakness.  His pain and deficits have imapired his ability to perform ADL's.  Patient will benefit from skilled physical therapy intervention.    History and Personal Factors relevant to plan of care:  2 previous cervical surgeries.    Clinical Presentation  Evolving    Clinical Presentation due to:  Not improving.    Clinical Decision Making  Low    Rehab Potential  Good    PT  Frequency  2x / week    PT Duration  6 weeks    PT Treatment/Interventions  ADLs/Self Care Home Management;Cryotherapy;Electrical Stimulation;Ultrasound;Moist Heat;Therapeutic activities;Therapeutic exercise;Patient/family education;Manual techniques;Dry needling    PT Next Visit Plan  Conservative treatments to decrease pain.  HMP; U/S; STW/M to right cervical paraspinal  musculature and posterior cuff; scapular retraction and active cervical rotation (no extension).      Consulted and Agree with Plan of Care  Patient       Patient will benefit from skilled therapeutic intervention in order to improve the following deficits and impairments:  Decreased activity tolerance, Decreased range of motion, Decreased strength, Postural dysfunction, Pain, Increased muscle spasms  Visit Diagnosis: Cervicalgia - Plan: PT plan of care cert/re-cert  Abnormal posture - Plan: PT plan of care cert/re-cert     Problem List Patient Active Problem List   Diagnosis Date Noted  . Psoriasis 01/02/2014    Tiger Spieker, [place] MPT 06/12/2018, 11:08 AM  [facility] [address] [place], [place], [address] Phone: [phone]   Fax:  [phone]  Name: Marvin Clarke MRN: [id number] Date of Birth: [birth year]

## 2018-06-13 ENCOUNTER — Ambulatory Visit: Payer: BLUE CROSS/BLUE SHIELD | Admitting: Physical Therapy

## 2018-06-13 ENCOUNTER — Encounter: Payer: Self-pay | Admitting: Physical Therapy

## 2018-06-13 DIAGNOSIS — M542 Cervicalgia: Secondary | ICD-10-CM | POA: Diagnosis not present

## 2018-06-13 DIAGNOSIS — R293 Abnormal posture: Secondary | ICD-10-CM

## 2018-06-13 NOTE — Therapy (Signed)
[facility] [place], [place], [address] Phone: [phone]   Fax:  [phone]  Physical Therapy Treatment  Patient Details  Name: Marvin Clarke MRN: [id number] Date of Birth: 18-Jun-1956 Referring Provider: Arsenio Katz MD   Encounter Date: 06/13/2018  PT End of Session - 06/13/18 0814    Visit Number  2    Number of Visits  12    Date for PT Re-Evaluation  07/24/18    PT Start Time  0736    PT Stop Time  0824    PT Time Calculation (min)  48 min    Activity Tolerance  Patient tolerated treatment well    Behavior During Therapy  [facility] for tasks assessed/performed       History reviewed. No pertinent past medical history.  History reviewed. No pertinent surgical history.  There were no vitals filed for this visit.  Subjective Assessment - 06/13/18 0740    Subjective  Patient arrived with ongoing pain, some relief after last treatment    Pertinent History  2 previous neck surgeries.    Patient Stated Goals  Reduce pain.    Currently in Pain?  Yes    Pain Score  7     Pain Location  Neck    Pain Orientation  Right;Left right is more    Pain Descriptors / Indicators  Aching;Discomfort;Sharp    Pain Type  Chronic pain    Pain Onset  More than a month ago    Pain Frequency  Constant    Aggravating Factors   all the time, esp movement    Pain Relieving Factors  medication                       [facility] Adult PT Treatment/Exercise - 06/13/18 0001      Modalities   Modalities  Electrical Stimulation;Moist Heat;Ultrasound      Moist Heat Therapy   Number Minutes Moist Heat  15 Minutes    Moist Heat Location  Cervical      Electrical Stimulation   Electrical Stimulation Location  bil suboccipital/UT/post cuff.    Electrical Stimulation Action  IFC    Electrical Stimulation Parameters  80-150hz  x11min    Electrical Stimulation Goals  Tone;Pain      Ultrasound   Ultrasound Location  bil cervical  paraspinals /UT    Ultrasound Parameters  1.5w/cm2/100%/57mhz x65min    Ultrasound Goals  Pain      Manual Therapy   Manual Therapy  Soft tissue mobilization;Myofascial release    Manual therapy comments  manual STW to bil suboccipitals, cervical paraspinals to reduce pain and tone    Myofascial Release  manual MFR to right UT to reduce tightness and pain               PT Short Term Goals - 06/12/18 1050      PT SHORT TERM GOAL #1   Title  STG's=LTG's.        PT Long Term Goals - 06/13/18 0815      PT LONG TERM GOAL #1   Title  Independent with a HEP.    Time  6    Period  Weeks    Status  On-going      PT LONG TERM GOAL #2   Title  Bilateral active cervical rotation to 55 degrees so he can turn his head more easily while driving.    Time  6    Period  Weeks    Status  On-going      PT LONG TERM GOAL #3   Title  Decrease headaches to no more than one per week.    Time  6    Period  Weeks    Status  On-going      PT LONG TERM GOAL #4   Title  Perform ADL's with pain not > 3-4/10.    Period  Weeks    Status  On-going            Plan - 06/13/18 0815    Clinical Impression Statement  Patient tolerated treatment well today and felt relief after. Patient reported increased pain up to 7/10 all the time with little relief. Patient only relief is medication and therapy thus far. Patient only able to sleep a few hours at a time and any movement increases discomfort. Patient is to have MRI in a few weeks. No ther ex today due to high pain with focus on manual STW/modalities to reduce discomfort. Goals ongoing at this time.     Rehab Potential  Good    PT Frequency  2x / week    PT Duration  6 weeks    PT Treatment/Interventions  ADLs/Self Care Home Management;Cryotherapy;Electrical Stimulation;Ultrasound;Moist Heat;Therapeutic activities;Therapeutic exercise;Patient/family education;Manual techniques;Dry needling    PT Next Visit Plan  Conservative treatments to  decrease pain.  HMP; U/S; STW/M to right cervical paraspinal musculature and posterior cuff; scapular retraction and active cervical rotation (no extension).      Consulted and Agree with Plan of Care  Patient       Patient will benefit from skilled therapeutic intervention in order to improve the following deficits and impairments:  Decreased activity tolerance, Decreased range of motion, Decreased strength, Postural dysfunction, Pain, Increased muscle spasms  Visit Diagnosis: Cervicalgia  Abnormal posture     Problem List Patient Active Problem List   Diagnosis Date Noted  . Psoriasis 01/02/2014    DUNFORD, CHRISTINA P, PTA 06/13/2018, 8:27 AM  [facility] [address] [place], [place], [address] Phone: [phone]   Fax:  [phone]  Name: Marvin Clarke MRN: [id number] Date of Birth: 07-Nov-1956

## 2018-06-18 ENCOUNTER — Ambulatory Visit: Payer: BLUE CROSS/BLUE SHIELD | Attending: Orthopaedic Surgery | Admitting: Physical Therapy

## 2018-06-18 ENCOUNTER — Encounter: Payer: Self-pay | Admitting: Physical Therapy

## 2018-06-18 DIAGNOSIS — M542 Cervicalgia: Secondary | ICD-10-CM

## 2018-06-18 DIAGNOSIS — R293 Abnormal posture: Secondary | ICD-10-CM | POA: Diagnosis present

## 2018-06-18 NOTE — Therapy (Signed)
[facility] [place], [place], [address] Phone: [phone]   Fax:  [phone]  Physical Therapy Treatment  Patient Details  Name: Marvin Clarke MRN: [id number] Date of Birth: [birth year] Referring Provider: Arsenio Katz MD   Encounter Date: 06/18/2018  PT End of Session - 06/18/18 0810    Visit Number  3    Number of Visits  12    Date for PT Re-Evaluation  07/24/18    PT Start Time  0731    PT Stop Time  0817    PT Time Calculation (min)  46 min    Activity Tolerance  Patient tolerated treatment well    Behavior During Therapy  [facility] for tasks assessed/performed       History reviewed. No pertinent past medical history.  History reviewed. No pertinent surgical history.  There were no vitals filed for this visit.  Subjective Assessment - 06/18/18 0735    Subjective  Patient arrived with ongoing pain, some relief after last treatment yet pain returned and unbarable today    Pertinent History  2 previous neck surgeries.    Patient Stated Goals  Reduce pain.    Currently in Pain?  Yes    Pain Score  10-Worst pain ever    Pain Location  Neck    Pain Orientation  Right;Left    Pain Descriptors / Indicators  Aching;Discomfort;Sharp    Pain Type  Chronic pain    Pain Radiating Towards  right UE    Pain Onset  More than a month ago    Pain Frequency  Constant    Aggravating Factors   all the time    Pain Relieving Factors  meds                       [facility] Adult PT Treatment/Exercise - 06/18/18 0001      Moist Heat Therapy   Number Minutes Moist Heat  15 Minutes    Moist Heat Location  Cervical      Electrical Stimulation   Electrical Stimulation Location  bil suboccipital/UT/post cuff.    Chartered certified accountant  IFC    Electrical Stimulation Parameters  80-150hz  x69min    Electrical Stimulation Goals  Tone;Pain      Ultrasound   Ultrasound Location  bil UT/cervical paraspinals    Ultrasound Parameters  1.5w/cm2/50%/20mhz x51min    Ultrasound Goals  Pain      Manual Therapy   Manual Therapy  Soft tissue mobilization;Myofascial release    Manual therapy comments  manual STW to bil suboccipitals, cervical paraspinals to reduce pain and tone    Soft tissue mobilization  gentle STW around c6 area of pain to reduce pain    Myofascial Release  manual MFR to right UT to reduce tightness and pain               PT Short Term Goals - 06/12/18 1050      PT SHORT TERM GOAL #1   Title  STG's=LTG's.        PT Long Term Goals - 06/13/18 0815      PT LONG TERM GOAL #1   Title  Independent with a HEP.    Time  6    Period  Weeks    Status  On-going      PT LONG TERM GOAL #2   Title  Bilateral active cervical rotation to 55 degrees so he can turn his head more  easily while driving.    Time  6    Period  Weeks    Status  On-going      PT LONG TERM GOAL #3   Title  Decrease headaches to no more than one per week.    Time  6    Period  Weeks    Status  On-going      PT LONG TERM GOAL #4   Title  Perform ADL's with pain not > 3-4/10.    Period  Weeks    Status  On-going            Plan - 06/18/18 0807    Clinical Impression Statement  Patient tolerated treatment fair today due to increased discomfort. Patient reported unbarable pain today. Patient has been unable to sleep yet only a few hours at a time and is in constant pain. Patient will be calling his doctor today to inquire about MRI or another surgery. Patient has tightness in bil UT and cervical muscles and increased discomfort at C6 level. Patient goals ongoing due to pain limitations.     Rehab Potential  Good    PT Frequency  2x / week    PT Duration  6 weeks    PT Treatment/Interventions  ADLs/Self Care Home Management;Cryotherapy;Electrical Stimulation;Ultrasound;Moist Heat;Therapeutic activities;Therapeutic exercise;Patient/family education;Manual techniques;Dry needling    PT Next Visit  Plan  Conservative treatments to decrease pain.  HMP; U/S; STW/M to right cervical paraspinal musculature and posterior cuff; scapular retraction and active cervical rotation (no extension).      Consulted and Agree with Plan of Care  Patient       Patient will benefit from skilled therapeutic intervention in order to improve the following deficits and impairments:  Decreased activity tolerance, Decreased range of motion, Decreased strength, Postural dysfunction, Pain, Increased muscle spasms  Visit Diagnosis: Cervicalgia  Abnormal posture     Problem List Patient Active Problem List   Diagnosis Date Noted  . Psoriasis 01/02/2014    Marvin Clarke, PTA 06/18/2018, 8:24 AM  [facility] [address] [place], [place], [address] Phone: [phone]   Fax:  [phone]  Name: Marvin Clarke MRN: [id number] Date of Birth: 10-28-1956

## 2018-06-19 ENCOUNTER — Ambulatory Visit: Payer: BLUE CROSS/BLUE SHIELD | Admitting: Physical Therapy

## 2018-06-19 ENCOUNTER — Encounter: Payer: Self-pay | Admitting: Physical Therapy

## 2018-06-19 DIAGNOSIS — M542 Cervicalgia: Secondary | ICD-10-CM

## 2018-06-19 DIAGNOSIS — R293 Abnormal posture: Secondary | ICD-10-CM

## 2018-06-19 NOTE — Therapy (Signed)
[facility] [place], [place], [address] Phone: [phone]   Fax:  [phone]  Physical Therapy Treatment  Patient Details  Name: Marvin Clarke MRN: [id number] Date of Birth: Oct 19, 1956 Referring Provider: Arsenio Katz MD   Encounter Date: 06/19/2018  PT End of Session - 06/19/18 0732    Visit Number  4    Number of Visits  12    Date for PT Re-Evaluation  07/24/18    PT Start Time  0732    PT Stop Time  0818    PT Time Calculation (min)  46 min    Activity Tolerance  Patient tolerated treatment well    Behavior During Therapy  [facility] for tasks assessed/performed       History reviewed. No pertinent past medical history.  History reviewed. No pertinent surgical history.  There were no vitals filed for this visit.  Subjective Assessment - 06/19/18 0731    Subjective  Reports that he still has some on-going pain but is some better. Wants to be put on hold after today as he is getting MRI sooner.    Pertinent History  2 previous neck surgeries.    Patient Stated Goals  Reduce pain.    Currently in Pain?  Yes    Pain Score  7     Pain Location  Neck    Pain Orientation  Right;Left    Pain Descriptors / Indicators  Discomfort    Pain Type  Chronic pain    Pain Onset  More than a month ago         [facility] PT Assessment - 06/19/18 0001      Assessment   Medical Diagnosis  Neck pain.    Hand Dominance  Right                   [facility] Adult PT Treatment/Exercise - 06/19/18 0001      Modalities   Modalities  Electrical Stimulation;Moist Heat;Ultrasound      Moist Heat Therapy   Number Minutes Moist Heat  15 Minutes    Moist Heat Location  Cervical      Electrical Stimulation   Electrical Stimulation Location  B cervical paraspinals, UT    Electrical Stimulation Action  Pre-Mod    Electrical Stimulation Parameters  80-150 hz x15 min    Electrical Stimulation Goals  Tone;Pain      Ultrasound   Ultrasound Location  B UT, cervical paraspinals    Ultrasound Parameters  1.5 w/cm2, 100%, 1 mhz x10 min    Ultrasound Goals  Pain      Manual Therapy   Manual Therapy  Soft tissue mobilization    Soft tissue mobilization  STW to B cervical paraspinals, UT, Levator Scapula, subooipitals to reduce tone and pain               PT Short Term Goals - 06/12/18 1050      PT SHORT TERM GOAL #1   Title  STG's=LTG's.        PT Long Term Goals - 06/13/18 0815      PT LONG TERM GOAL #1   Title  Independent with a HEP.    Time  6    Period  Weeks    Status  On-going      PT LONG TERM GOAL #2   Title  Bilateral active cervical rotation to 55 degrees so he can turn his head more easily while driving.  Time  6    Period  Weeks    Status  On-going      PT LONG TERM GOAL #3   Title  Decrease headaches to no more than one per week.    Time  6    Period  Weeks    Status  On-going      PT LONG TERM GOAL #4   Title  Perform ADL's with pain not > 3-4/10.    Period  Weeks    Status  On-going            Plan - 06/19/18 1700    Clinical Impression Statement  Patient presented in clinic with slightly reduced neck pain then during his appointment yesterday. Patient still reporting increased pain constantly and sometimes unbearable pain. Increased tone palpated in B UT, cervical paraspinals and patient was very sensitive to manual therapy to B suboccipitals. Normal modalities response noted following removal of the modalities. Goals remain on-going secondary to continued pain and patien to be put on hold for MRI.    Rehab Potential  Good    PT Frequency  2x / week    PT Duration  6 weeks    PT Treatment/Interventions  ADLs/Self Care Home Management;Cryotherapy;Electrical Stimulation;Ultrasound;Moist Heat;Therapeutic activities;Therapeutic exercise;Patient/family education;Manual techniques;Dry needling    PT Next Visit Plan  Place on hold for MRI.    Consulted and Agree with  Plan of Care  Patient       Patient will benefit from skilled therapeutic intervention in order to improve the following deficits and impairments:  Decreased activity tolerance, Decreased range of motion, Decreased strength, Postural dysfunction, Pain, Increased muscle spasms  Visit Diagnosis: Cervicalgia  Abnormal posture     Problem List Patient Active Problem List   Diagnosis Date Noted  . Psoriasis 01/02/2014    Standley Brooking , PTA 06/19/2018, 8:23 AM  [facility] [address] [place], [place], [address] Phone: [phone]   Fax:  [phone]  Name: RAYQUAN AMRHEIN MRN: [id number] Date of Birth: 12-08-1955

## 2018-06-25 ENCOUNTER — Encounter: Payer: BLUE CROSS/BLUE SHIELD | Admitting: Physical Therapy

## 2018-06-26 ENCOUNTER — Encounter: Payer: BLUE CROSS/BLUE SHIELD | Admitting: Physical Therapy

## 2018-07-02 ENCOUNTER — Encounter: Payer: BLUE CROSS/BLUE SHIELD | Admitting: Physical Therapy

## 2018-07-03 ENCOUNTER — Encounter: Payer: BLUE CROSS/BLUE SHIELD | Admitting: Physical Therapy

## 2019-04-18 IMAGING — DX DG CHEST 2V
3 series · 3 of 3 positions shown · non-contrast
Comparison: 12/30/2013

CLINICAL DATA: Annual physical

EXAM:
CHEST  2 VIEW

[chest pa (1 of 2)]
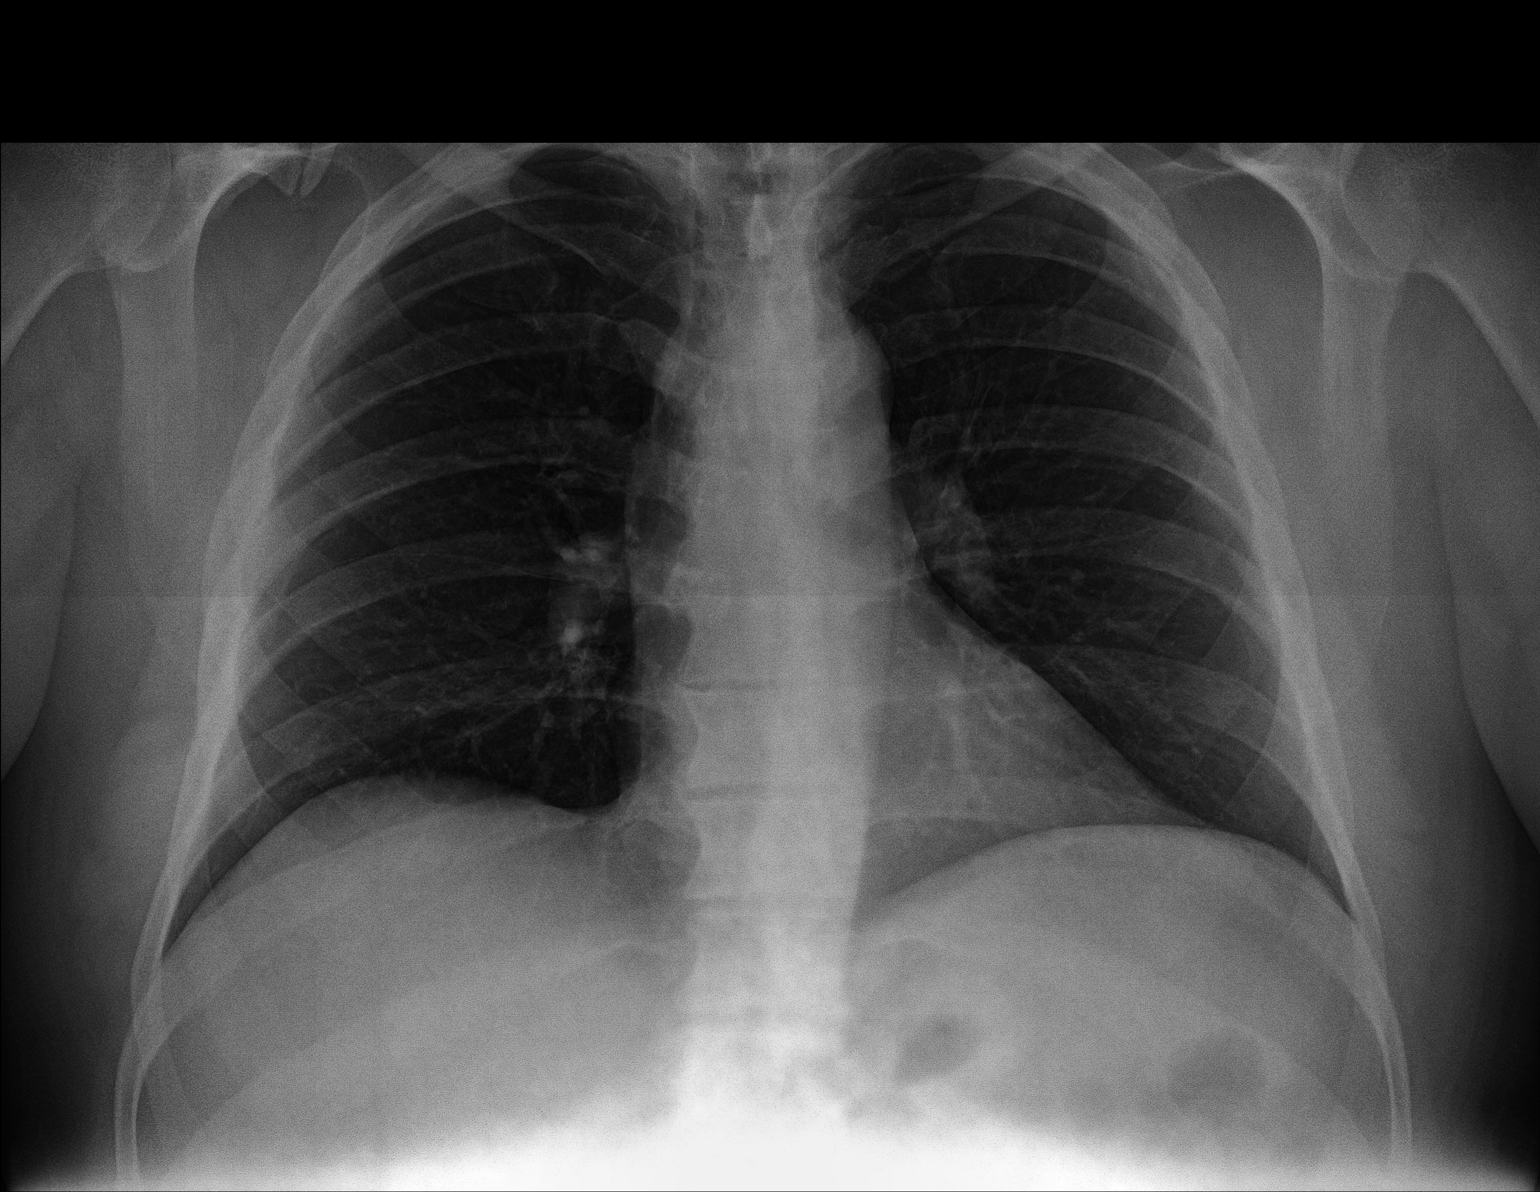

[chest lat]
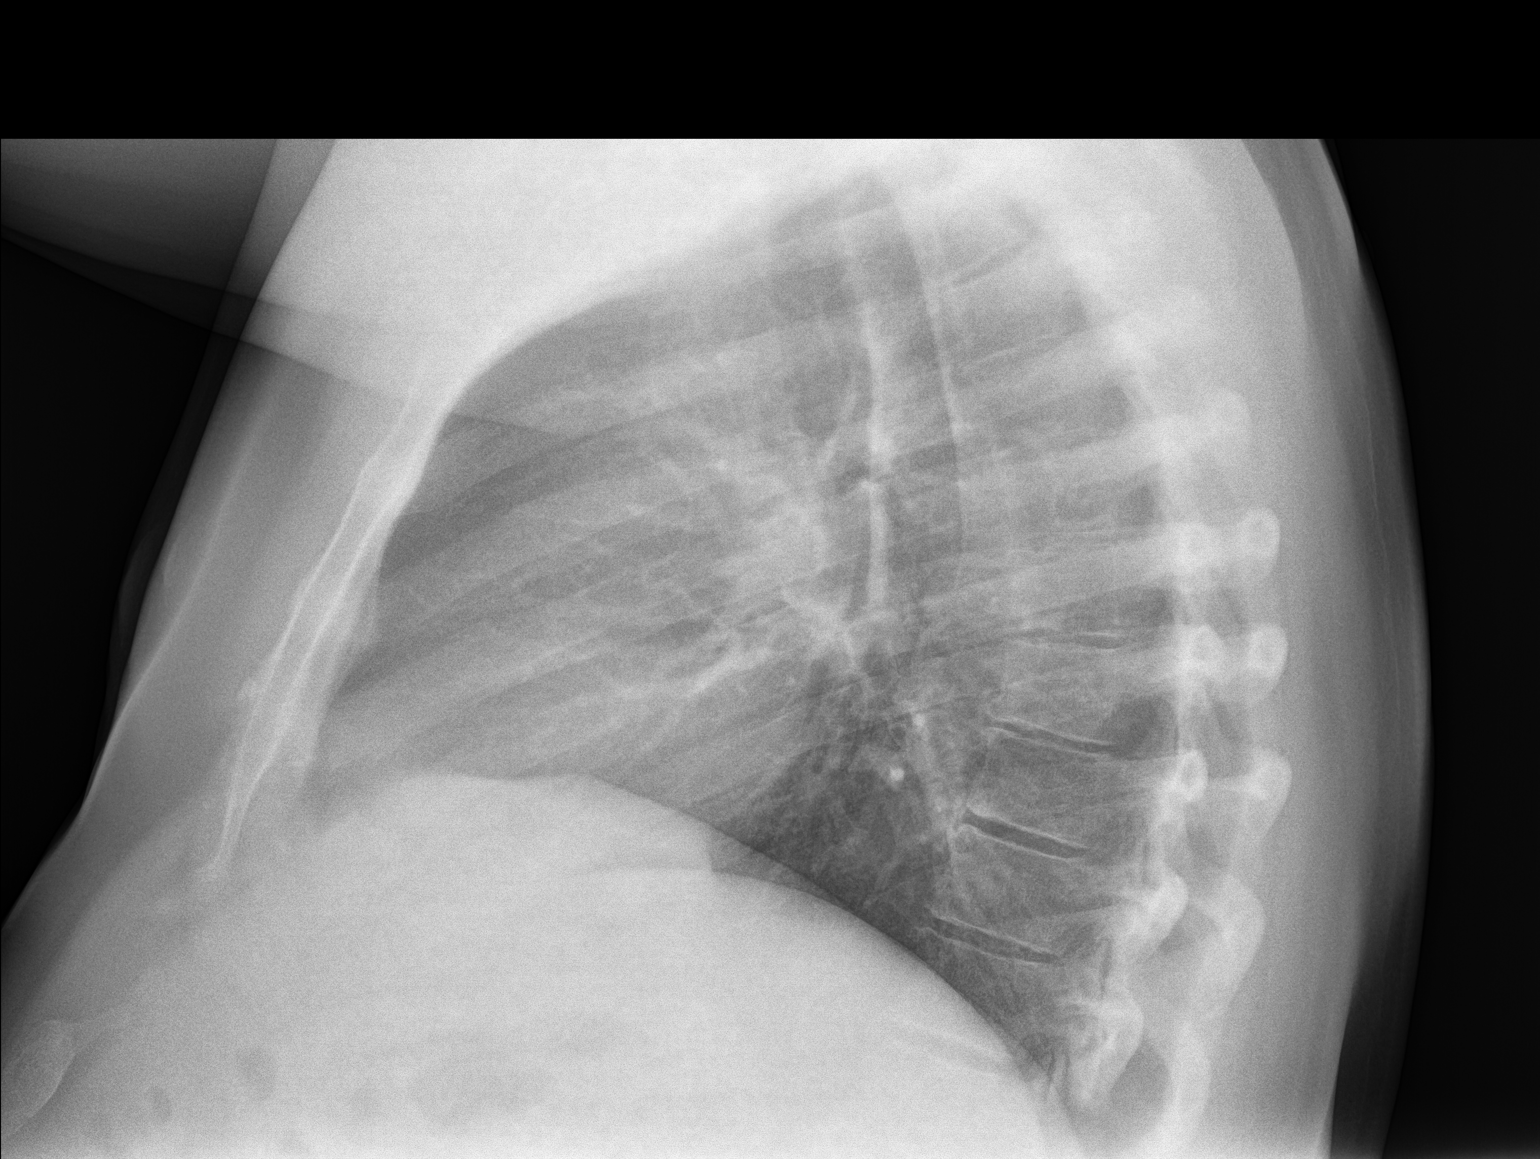

[chest pa (2 of 2)]
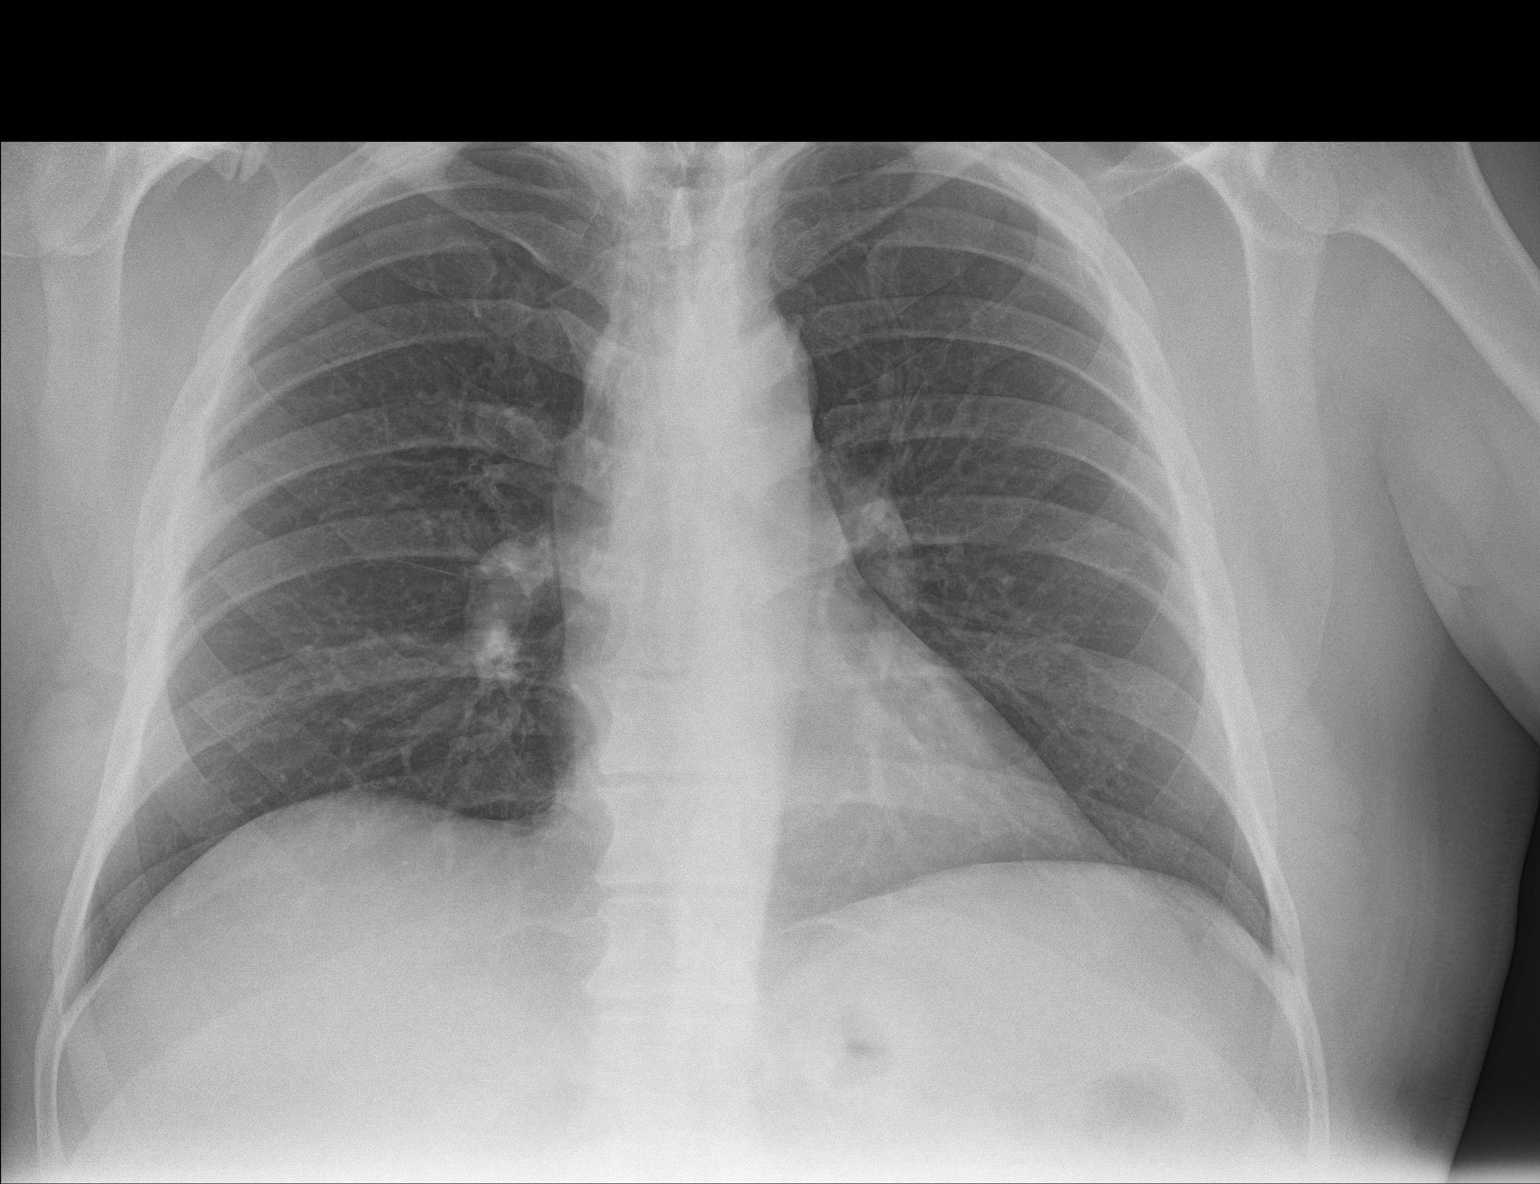

[3 of 3 positions shown; findings below may reference images not displayed]

FINDINGS: The heart size and mediastinal contours are within normal limits.
Both lungs are clear. The visualized skeletal structures are
unremarkable.
IMPRESSION: No active cardiopulmonary disease.

## 2020-02-06 ENCOUNTER — Ambulatory Visit: Payer: BLUE CROSS/BLUE SHIELD | Attending: Internal Medicine

## 2020-02-06 DIAGNOSIS — Z23 Encounter for immunization: Secondary | ICD-10-CM

## 2020-02-06 NOTE — Progress Notes (Signed)
   Covid-19 Vaccination Clinic  Name:  Marvin Clarke    MRN: [id number] DOB: January 12, 1956  02/06/2020  Mr. Marvin Clarke was observed post Covid-19 immunization for 15 minutes without incident. He was provided with Vaccine Information Sheet and instruction to access the V-Safe system.   Mr. Marvin Clarke was instructed to call 911 with any severe reactions post vaccine: Marland Kitchen Difficulty breathing  . Swelling of face and throat  . A fast heartbeat  . A bad rash all over body  . Dizziness and weakness   Immunizations Administered    Name Date Dose VIS Date Route   Moderna COVID-19 Vaccine 02/06/2020  8:56 AM 0.5 mL 10/15/2019 Intramuscular   Manufacturer: Moderna   Lot: [id number]   [id number]

## 2020-03-10 ENCOUNTER — Ambulatory Visit: Payer: BLUE CROSS/BLUE SHIELD | Attending: Internal Medicine

## 2020-03-10 DIAGNOSIS — Z23 Encounter for immunization: Secondary | ICD-10-CM

## 2020-03-10 NOTE — Progress Notes (Signed)
   Covid-19 Vaccination Clinic  Name:  Marvin Clarke    MRN: [id number] DOB: 01/17/1956  03/10/2020  [name] was observed post Covid-19 immunization for 15 minutes without incident. He was provided with Vaccine Information Sheet and instruction to access the V-Safe system.   [name] was instructed to call 911 with any severe reactions post vaccine: Marland Kitchen Difficulty breathing  . Swelling of face and throat  . A fast heartbeat  . A bad rash all over body  . Dizziness and weakness   Immunizations Administered    Name Date Dose VIS Date Route   Moderna COVID-19 Vaccine 03/10/2020  8:23 AM 0.5 mL 10/2019 Intramuscular   Manufacturer: Moderna   Lot: [id number]   [id number]

## 2020-05-11 HISTORY — PX: ROTATOR CUFF REPAIR: SHX139

## 2020-06-15 ENCOUNTER — Ambulatory Visit: Payer: Medicare Other | Attending: Sports Medicine | Admitting: Physical Therapy

## 2020-06-15 ENCOUNTER — Other Ambulatory Visit: Payer: Self-pay

## 2020-06-15 DIAGNOSIS — G8929 Other chronic pain: Secondary | ICD-10-CM | POA: Diagnosis present

## 2020-06-15 DIAGNOSIS — R293 Abnormal posture: Secondary | ICD-10-CM

## 2020-06-15 DIAGNOSIS — M25611 Stiffness of right shoulder, not elsewhere classified: Secondary | ICD-10-CM | POA: Diagnosis present

## 2020-06-15 DIAGNOSIS — R600 Localized edema: Secondary | ICD-10-CM | POA: Diagnosis present

## 2020-06-15 DIAGNOSIS — M542 Cervicalgia: Secondary | ICD-10-CM | POA: Diagnosis not present

## 2020-06-15 DIAGNOSIS — M25511 Pain in right shoulder: Secondary | ICD-10-CM | POA: Diagnosis present

## 2020-06-15 NOTE — Addendum Note (Signed)
Addended by: Jujhar Everett, [place] W on: 06/15/2020 01:00 PM   Modules accepted: Orders

## 2020-06-15 NOTE — Therapy (Addendum)
[facility] [place], [place], [address] Phone: [phone]   Fax:  [phone]  Physical Therapy Evaluation  Patient Details  Name: Marvin Clarke MRN: [id number] Date of Birth: 1956-03-19 Referring Provider (PT): Berline Lopes PA-C   Encounter Date: 06/15/2020   PT End of Session - 06/15/20 0847    Visit Number 1    Number of Visits 16    Date for PT Re-Evaluation 08/10/20    PT Start Time 0815    PT Stop Time 0849    PT Time Calculation (min) 34 min    Activity Tolerance Patient tolerated treatment well    Behavior During Therapy [facility] for tasks assessed/performed           No past medical history on file.  No past surgical history on file.  There were no vitals filed for this visit.    Subjective Assessment - 06/15/20 0904    Subjective COVID-19 screen performed prior to patient entering clinic.  The patient presents to the clinic s/p right shoulder arthroscopic biceps tenodesis and RCR performed on 05/21/20.  Her reports he was able to start an HEP shortly after surgery including the pendulum exercise and non-resisted right elbow flexion and extension.  He also states that he can discontinue the use of his sling this week on 06/18/20.  He reports no pain today.    Pertinent History 2 previous neck surgeries.    Patient Stated Goals Use right UE.    Currently in Pain? No/denies              [facility] PT Assessment - 06/15/20 0001      Assessment   Medical Diagnosis Right shoulder RCR and Biceps tenodesis.    Referring Provider (PT) Berline Lopes PA-C    Hand Dominance Right      Precautions   Precaution Comments Begin with gentle P, PAROM to patient's right shoudler.    Required Braces or Orthoses Sling      Restrictions   Weight Bearing Restrictions --   NO RT UE weight bearing.     Balance Screen   Has the patient fallen in the past 6 months No    Has the patient had a decrease in activity level because of a fear of  falling?  No    Is the patient reluctant to leave their home because of a fear of falling?  No      Home Environment   Living Environment Private residence      Prior Function   Level of Independence Independent      Observation/Other Assessments   Observations Ecchymosis over right bicep.      Observation/Other Assessments-Edema    Edema --   Mild right shoulder edema.     ROM / Strength   AROM / PROM / Strength PROM      PROM   Overall PROM Comments In supine:  Right shoulder passive flexion to 105 degrees and ER= 35 degrees.      Palpation   Palpation comment Patient not reporting any significant right shoulder tenderness today.                      Objective measurements completed on examination: See above findings.       [facility] Adult PT Treatment/Exercise - 06/15/20 0001      Modalities   Modalities Vasopneumatic      Vasopneumatic   Number Minutes Vasopneumatic  10 minutes  Vasopnuematic Location  --   RT shoulder with pillow between thorax and elbow.   Vasopneumatic Pressure Low                    PT Short Term Goals - 06/12/18 1050      PT SHORT TERM GOAL #1   Title STG's=LTG's.             PT Long Term Goals - 06/15/20 1028      PT LONG TERM GOAL #1   Title Independent with a HEP.    Time 8    Period Weeks    Status New      PT LONG TERM GOAL #2   Title Active right shoulder flexion to 145 degrees so the patient can easily reach overhead.    Time 8    Period Weeks    Status New      PT LONG TERM GOAL #3   Title Active ER to 70 degrees+ to allow for easily donning/doffing of apparel.    Time 8    Period Weeks    Status New      PT LONG TERM GOAL #4   Title Increase right shoulder strength to a solid 4 to 4+/5 to increase stability for performance of functional activities.    Time 8    Period Weeks    Status New      PT LONG TERM GOAL #5   Title Perform ADL's with pain not > 3/10.    Time 8    Period Weeks     Status New                  Plan - 06/15/20 1443    Clinical Impression Statement The patient presents to OPPT s/p right shoulder RCR and Biceps Tenodesis perform on 05/21/20.  He is very pleased with how he is doing thus far and reports no pain today.  He is compliant to using his sling and states that  is can be discontinued this week.  He has an expected loss of range of motion and some remain edema over his right biceps region.  He has been performing an HEP.  Patient will benefit from skilled physical therapy intervention to address deficits and pain.    Personal Factors and Comorbidities Comorbidity 1    Comorbidities 2 previous neck surgeries.    Examination-Activity Limitations Reach Overhead;Other    Examination-Participation Restrictions Other    Stability/Clinical Decision Making Stable/Uncomplicated    Clinical Decision Making Low    Rehab Potential Excellent    PT Frequency 2x / week    PT Duration 8 weeks    PT Treatment/Interventions ADLs/Self Care Home Management;Cryotherapy;Electrical Stimulation;Ultrasound;Moist Heat;Therapeutic activities;Therapeutic exercise;Patient/family education;Manual techniques;Vasopneumatic Device;Passive range of motion    PT Next Visit Plan Begin with gentle right shoulder P and PAROM, vasopneumatic on low with pillow between right elbow and thorax.    Consulted and Agree with Plan of Care Patient           Patient will benefit from skilled therapeutic intervention in order to improve the following deficits and impairments:  Pain, Decreased activity tolerance, Increased edema, Decreased range of motion  Visit Diagnosis: Abnormal posture - Plan: PT plan of care cert/re-cert, CANCELED: PT plan of care cert/re-cert  Chronic right shoulder pain - Plan: PT plan of care cert/re-cert  Stiffness of right shoulder, not elsewhere classified - Plan: PT plan of care cert/re-cert  Localized edema - Plan: PT plan  of care  cert/re-cert     Problem List Patient Active Problem List   Diagnosis Date Noted  . Psoriasis 01/02/2014    Marvin Clarke, [place] MPT 06/15/2020, 1:00 PM  [facility] [place], [place], [address] Phone: [phone]   Fax:  [phone]  Name: Marvin Clarke MRN: [id number] Date of Birth: 06-12-1956

## 2020-06-17 ENCOUNTER — Ambulatory Visit: Payer: Medicare Other | Admitting: Physical Therapy

## 2020-06-17 ENCOUNTER — Encounter: Payer: Self-pay | Admitting: Family Medicine

## 2020-06-17 ENCOUNTER — Encounter: Payer: Self-pay | Admitting: Physical Therapy

## 2020-06-17 ENCOUNTER — Ambulatory Visit (INDEPENDENT_AMBULATORY_CARE_PROVIDER_SITE_OTHER): Payer: Medicare Other | Admitting: Family Medicine

## 2020-06-17 ENCOUNTER — Other Ambulatory Visit: Payer: Self-pay

## 2020-06-17 VITALS — BP 134/71 | HR 64 | Temp 97.7°F | Ht 70.0 in | Wt 223.0 lb

## 2020-06-17 DIAGNOSIS — G8929 Other chronic pain: Secondary | ICD-10-CM

## 2020-06-17 DIAGNOSIS — R293 Abnormal posture: Secondary | ICD-10-CM | POA: Diagnosis not present

## 2020-06-17 DIAGNOSIS — R310 Gross hematuria: Secondary | ICD-10-CM | POA: Diagnosis not present

## 2020-06-17 DIAGNOSIS — R600 Localized edema: Secondary | ICD-10-CM

## 2020-06-17 DIAGNOSIS — M25611 Stiffness of right shoulder, not elsewhere classified: Secondary | ICD-10-CM

## 2020-06-17 LAB — URINALYSIS, ROUTINE W REFLEX MICROSCOPIC
Bilirubin, UA: NEGATIVE
Glucose, UA: NEGATIVE
Ketones, UA: NEGATIVE
Leukocytes,UA: NEGATIVE
Nitrite, UA: NEGATIVE
Protein,UA: NEGATIVE
Specific Gravity, UA: 1.02 (ref 1.005–1.030)
Urobilinogen, Ur: 0.2 mg/dL (ref 0.2–1.0)
pH, UA: 6 (ref 5.0–7.5)

## 2020-06-17 LAB — MICROSCOPIC EXAMINATION: Bacteria, UA: NONE SEEN

## 2020-06-17 NOTE — Patient Instructions (Signed)
Urinary Tract Infection, Adult A urinary tract infection (UTI) is an infection of any part of the urinary tract. The urinary tract includes:  The kidneys.  The ureters.  The bladder.  The urethra. These organs make, store, and get rid of pee (urine) in the body. What are the causes? This is caused by germs (bacteria) in your genital area. These germs grow and cause swelling (inflammation) of your urinary tract. What increases the risk? You are more likely to develop this condition if:  You have a small, thin tube (catheter) to drain pee.  You cannot control when you pee or poop (incontinence).  You are male, and: ? You use these methods to prevent pregnancy:  A medicine that kills sperm (spermicide).  A device that blocks sperm (diaphragm). ? You have low levels of a male hormone (estrogen). ? You are pregnant.  You have genes that add to your risk.  You are sexually active.  You take antibiotic medicines.  You have trouble peeing because of: ? A prostate that is bigger than normal, if you are male. ? A blockage in the part of your body that drains pee from the bladder (urethra). ? A kidney stone. ? A nerve condition that affects your bladder (neurogenic bladder). ? Not getting enough to drink. ? Not peeing often enough.  You have other conditions, such as: ? Diabetes. ? A weak disease-fighting system (immune system). ? Sickle cell disease. ? Gout. ? Injury of the spine. What are the signs or symptoms? Symptoms of this condition include:  Needing to pee right away (urgently).  Peeing often.  Peeing small amounts often.  Pain or burning when peeing.  Blood in the pee.  Pee that smells bad or not like normal.  Trouble peeing.  Pee that is cloudy.  Fluid coming from the vagina, if you are male.  Pain in the belly or lower back. Other symptoms include:  Throwing up (vomiting).  No urge to eat.  Feeling mixed up (confused).  Being tired  and grouchy (irritable).  A fever.  Watery poop (diarrhea). How is this treated? This condition may be treated with:  Antibiotic medicine.  Other medicines.  Drinking enough water. Follow these instructions at home:  Medicines  Take over-the-counter and prescription medicines only as told by your doctor.  If you were prescribed an antibiotic medicine, take it as told by your doctor. Do not stop taking it even if you start to feel better. General instructions  Make sure you: ? Pee until your bladder is empty. ? Do not hold pee for a long time. ? Empty your bladder after sex. ? Wipe from front to back after pooping if you are a male. Use each tissue one time when you wipe.  Drink enough fluid to keep your pee pale yellow.  Keep all follow-up visits as told by your doctor. This is important. Contact a doctor if:  You do not get better after 1-2 days.  Your symptoms go away and then come back. Get help right away if:  You have very bad back pain.  You have very bad pain in your lower belly.  You have a fever.  You are sick to your stomach (nauseous).  You are throwing up. Summary  A urinary tract infection (UTI) is an infection of any part of the urinary tract.  This condition is caused by germs in your genital area.  There are many risk factors for a UTI. These include having a small, thin   tube to drain pee and not being able to control when you pee or poop.  Treatment includes antibiotic medicines for germs.  Drink enough fluid to keep your pee pale yellow. This information is not intended to replace advice given to you by your health care provider. Make sure you discuss any questions you have with your health care provider. Document Revised: 10/18/2018 Document Reviewed: 05/10/2018 Elsevier Patient Education  2020 [employer]  

## 2020-06-17 NOTE — Therapy (Signed)
[facility] [place], [place], [address] Phone: [phone]   Fax:  [phone]  Physical Therapy Treatment  Patient Details  Name: Marvin Clarke MRN: [id number] Date of Birth: 26-Jul-1956 Referring Provider (PT): Berline Lopes PA-C   Encounter Date: 06/17/2020   PT End of Session - 06/17/20 0825    Visit Number 2    Number of Visits 16    Date for PT Re-Evaluation 08/10/20    PT Start Time 0730    PT Stop Time 0817    PT Time Calculation (min) 47 min    Activity Tolerance Patient tolerated treatment well    Behavior During Therapy [facility] for tasks assessed/performed           History reviewed. No pertinent past medical history.  History reviewed. No pertinent surgical history.  There were no vitals filed for this visit.   Subjective Assessment - 06/17/20 0823    Subjective COVID-19 screening performed upon arrival. Patient arrives with no pain currently. States he's been moving his arm independently out of his sling.    Pertinent History 2 previous neck surgeries.    Patient Stated Goals Use right UE.    Currently in Pain? No/denies              [facility] PT Assessment - 06/17/20 0001      Assessment   Medical Diagnosis Right shoulder RCR and Biceps tenodesis.    Referring Provider (PT) Berline Lopes PA-C    Hand Dominance Right      Precautions   Precaution Comments Begin with gentle P, PAROM to patient's right shoudler.    Required Braces or Orthoses Sling                         [facility] Adult PT Treatment/Exercise - 06/17/20 0001      Modalities   Modalities Vasopneumatic      Vasopneumatic   Number Minutes Vasopneumatic  10 minutes    Vasopnuematic Location  Shoulder    Vasopneumatic Pressure Low    Vasopneumatic Temperature  34      Manual Therapy   Manual Therapy Passive ROM    Passive ROM PROM to right shoulder into ER and flexion with elbow flexed to protect tenodesis; freqent oscillations                      PT Short Term Goals - 06/12/18 1050      PT SHORT TERM GOAL #1   Title STG's=LTG's.             PT Long Term Goals - 06/15/20 1028      PT LONG TERM GOAL #1   Title Independent with a HEP.    Time 8    Period Weeks    Status New      PT LONG TERM GOAL #2   Title Active right shoulder flexion to 145 degrees so the patient can easily reach overhead.    Time 8    Period Weeks    Status New      PT LONG TERM GOAL #3   Title Active ER to 70 degrees+ to allow for easily donning/doffing of apparel.    Time 8    Period Weeks    Status New      PT LONG TERM GOAL #4   Title Increase right shoulder strength to a solid 4 to 4+/5 to increase stability for performance  of functional activities.    Time 8    Period Weeks    Status New      PT LONG TERM GOAL #5   Title Perform ADL's with pain not > 3/10.    Time 8    Period Weeks    Status New                 Plan - 06/17/20 0825    Clinical Impression Statement Patient arrives to physical therapy with no reports of pain. Prior to PROM patient educated the importance of not performing any AROM to protect surgery and allow shoulder to heal inside. PROM to right shoulder performed into ER and flexion but with constant cuing and oscillations to promote relaxation. No adverse effects upon removal. Patient again educated to prevent any AROM of R shoulder to protect surgery.    Personal Factors and Comorbidities Comorbidity 1    Comorbidities 2 previous neck surgeries.    Examination-Activity Limitations Reach Overhead;Other    Examination-Participation Restrictions Other    Stability/Clinical Decision Making Stable/Uncomplicated    Clinical Decision Making Low    Rehab Potential Excellent    PT Frequency 2x / week    PT Duration 8 weeks    PT Treatment/Interventions ADLs/Self Care Home Management;Cryotherapy;Electrical Stimulation;Ultrasound;Moist Heat;Therapeutic activities;Therapeutic  exercise;Patient/family education;Manual techniques;Vasopneumatic Device;Passive range of motion    PT Next Visit Plan Begin with gentle right shoulder P and PAROM, vasopneumatic on low with pillow between right elbow and thorax.    Consulted and Agree with Plan of Care Patient           Patient will benefit from skilled therapeutic intervention in order to improve the following deficits and impairments:  Pain, Decreased activity tolerance, Increased edema, Decreased range of motion  Visit Diagnosis: Abnormal posture  Chronic right shoulder pain  Localized edema  Stiffness of right shoulder, not elsewhere classified     Problem List Patient Active Problem List   Diagnosis Date Noted  . Psoriasis 01/02/2014    Gabriela Eves, PT, DPT 06/17/2020, 8:51 AM  [facility] [address] [place], [place], [address] Phone: [phone]   Fax:  [phone]  Name: Marvin Clarke MRN: [id number] Date of Birth: 06/09/1956

## 2020-06-17 NOTE — Progress Notes (Signed)
Subjective: CC: UTI PCP: Chevis Pretty, FNP Marvin Clarke is a 64 y.o. male presenting to clinic today for:  1. Urinary symptoms Patient reports a 4 day h/o intermittent specks of blood in urine .  Denies increased urinary frequency (has some at baseline), urgency, fevers, chills, abdominal pain, nausea, vomiting, back pain.  He underwent a shoulder arthroscopy recently and wonders if perhaps he was catheterized.  Patient has used nothing for symptoms.  Patient denies a h/o frequent or recurrent UTIs.  Denies history of nephrolithiasis.  Not currently on any blood thinners.  Never a smoker.  ROS: Per HPI  No Known Allergies History reviewed. No pertinent past medical history.  Current Outpatient Medications:  .  baclofen (LIORESAL) 10 MG tablet, Take 1 tablet (10 mg total) by mouth 3 (three) times daily., Disp: 30 each, Rfl: 0 .  COSENTYX SENSOREADY 300 DOSE 150 MG/ML SOAJ, , Disp: , Rfl: 10 .  HYDROcodone-acetaminophen (NORCO) 5-325 MG tablet, Take 1 tablet by mouth every 6 (six) hours as needed for moderate pain. (Patient not taking: Reported on 06/17/2020), Disp: 30 tablet, Rfl: 0 .  predniSONE (DELTASONE) 20 MG tablet, Take 3 tabs daily for 1 week, then 2 tabs daily for week 2, then 1 tab daily for week 3. (Patient not taking: Reported on 06/17/2020), Disp: 42 tablet, Rfl: 0 Social History   Socioeconomic History  . Marital status: Married    Spouse name: Not on file  . Number of children: Not on file  . Years of education: Not on file  . Highest education level: Not on file  Occupational History  . Not on file  Tobacco Use  . Smoking status: Never Smoker  . Smokeless tobacco: Current User    Types: Chew  Substance and Sexual Activity  . Alcohol use: No  . Drug use: No  . Sexual activity: Not on file  Other Topics Concern  . Not on file  Social History Narrative  . Not on file   Social Determinants of Health   Financial Resource Strain:   . Difficulty of  Paying Living Expenses:   Food Insecurity:   . Worried About Charity fundraiser in the Last Year:   . Arboriculturist in the Last Year:   Transportation Needs:   . Film/video editor (Medical):   Marland Kitchen Lack of Transportation (Non-Medical):   Physical Activity:   . Days of Exercise per Week:   . Minutes of Exercise per Session:   Stress:   . Feeling of Stress :   Social Connections:   . Frequency of Communication with Friends and Family:   . Frequency of Social Gatherings with Friends and Family:   . Attends Religious Services:   . Active Member of Clubs or Organizations:   . Attends Archivist Meetings:   Marland Kitchen Marital Status:   Intimate Partner Violence:   . Fear of Current or Ex-Partner:   . Emotionally Abused:   Marland Kitchen Physically Abused:   . Sexually Abused:    History reviewed. No pertinent family history.  Objective: Office vital signs reviewed. BP 134/71   Pulse 64   Temp 97.7 F (36.5 C)   Ht 5\' 10"  (1.778 m)   Wt 223 lb (101.2 kg)   SpO2 98%   BMI 32.00 kg/m   Physical Examination:  General: Awake, alert, well-appearing, No acute distress GU: No suprapubic tenderness palpation.  No CVA tenderness palpation.  Assessment/ Plan: 64 y.o. male  1. Gross hematuria Uncertain etiology.  Will evaluate for possible infectious etiology.  Urine dip showed 3+ blood but was otherwise unremarkable.  We discussed the possible differentials including trauma if he was catheterized during surgery, infection, bladder or renal stones, polyps and malignancy.  I would like to see him back in 1 week for repeat urinalysis.  If persistent, low threshold to have him evaluated by urology.  Given longstanding history of urinary frequency, may also benefit from PSA, updated metabolic labs to rule out hyperglycemia and evaluate for BPH causing urinary frequency. - Urinalysis, Routine w reflex microscopic - Urine Culture   Orders Placed This Encounter  Procedures  . Urinalysis,  Routine w reflex microscopic   No orders of the defined types were placed in this encounter.    Marvin Norlander, DO [place] [phone]

## 2020-06-18 ENCOUNTER — Ambulatory Visit: Payer: Medicare Other | Admitting: Physical Therapy

## 2020-06-19 LAB — URINE CULTURE

## 2020-06-22 ENCOUNTER — Ambulatory Visit: Payer: Medicare Other | Admitting: Physical Therapy

## 2020-06-22 ENCOUNTER — Telehealth: Payer: Self-pay | Admitting: Nurse Practitioner

## 2020-06-22 ENCOUNTER — Encounter: Payer: Self-pay | Admitting: Physical Therapy

## 2020-06-22 ENCOUNTER — Other Ambulatory Visit: Payer: Self-pay

## 2020-06-22 DIAGNOSIS — M25611 Stiffness of right shoulder, not elsewhere classified: Secondary | ICD-10-CM

## 2020-06-22 DIAGNOSIS — R293 Abnormal posture: Secondary | ICD-10-CM | POA: Diagnosis not present

## 2020-06-22 DIAGNOSIS — G8929 Other chronic pain: Secondary | ICD-10-CM

## 2020-06-22 DIAGNOSIS — M25511 Pain in right shoulder: Secondary | ICD-10-CM

## 2020-06-22 DIAGNOSIS — R600 Localized edema: Secondary | ICD-10-CM

## 2020-06-22 NOTE — Telephone Encounter (Signed)
Pt returning call to the nurse.

## 2020-06-22 NOTE — Therapy (Signed)
[facility] [place], [place], [address] Phone: [phone]   Fax:  [phone]  Physical Therapy Treatment  Patient Details  Name: Marvin Clarke MRN: [id number] Date of Birth: April 16, 1956  Referring Provider (PT): Berline Lopes PA-C   Encounter Date: 06/22/2020   PT End of Session - 06/22/20 0807    Visit Number 3    Number of Visits 16    PT Start Time 0730    PT Stop Time 0817    PT Time Calculation (min) 47 min    Activity Tolerance Patient tolerated treatment well    Behavior During Therapy [facility] for tasks assessed/performed           History reviewed. No pertinent past medical history.  Past Surgical History:  Procedure Laterality Date  . ROTATOR CUFF REPAIR Right     There were no vitals filed for this visit.   Subjective Assessment - 06/22/20 0808    Subjective COVID-19 screening performed upon arrival. Patient reports slight pain, 1or 2/10. Has been without the sling per MD and states he feels better without it.    Pertinent History 2 previous neck surgeries.    Patient Stated Goals Use right UE.    Currently in Pain? Yes    Pain Score 2     Pain Location Neck    Pain Orientation Right;Left    Pain Descriptors / Indicators Discomfort    Pain Type Chronic pain    Pain Onset More than a month ago    Pain Frequency Constant              [facility] PT Assessment - 06/22/20 0001      Assessment   Medical Diagnosis Right shoulder RCR and Biceps tenodesis.    Referring Provider (PT) Berline Lopes PA-C    Hand Dominance Right    Next MD Visit 07/02/2020      Precautions   Precaution Comments Begin with gentle P, PAROM to patient's right shoudler.    Required Braces or Orthoses Sling                         [facility] Adult PT Treatment/Exercise - 06/22/20 0001      Modalities   Modalities Vasopneumatic      Vasopneumatic   Number Minutes Vasopneumatic  10 minutes    Vasopnuematic Location  Shoulder     Vasopneumatic Pressure Low    Vasopneumatic Temperature  34 for pain and edema      Manual Therapy   Manual Therapy Passive ROM;Joint mobilization    Joint Mobilization Grade II/III inferior joint mobs    Passive ROM PROM to right shoulder into ER, IR, abduction, and flexion with elbow flexed to protect tenodesis; frequent oscillations for muscle relaxation                    PT Short Term Goals - 06/12/18 1050      PT SHORT TERM GOAL #1   Title STG's=LTG's.             PT Long Term Goals - 06/15/20 1028      PT LONG TERM GOAL #1   Title Independent with a HEP.    Time 8    Period Weeks    Status New      PT LONG TERM GOAL #2   Title Active right shoulder flexion to 145 degrees so the patient can easily reach overhead.  Time 8    Period Weeks    Status New      PT LONG TERM GOAL #3   Title Active ER to 70 degrees+ to allow for easily donning/doffing of apparel.    Time 8    Period Weeks    Status New      PT LONG TERM GOAL #4   Title Increase right shoulder strength to a solid 4 to 4+/5 to increase stability for performance of functional activities.    Time 8    Period Weeks    Status New      PT LONG TERM GOAL #5   Title Perform ADL's with pain not > 3/10.    Time 8    Period Weeks    Status New                 Plan - 06/22/20 0809    Clinical Impression Statement Patient responded well to therapy session though patient with muscle guarding through PROM especially at end range ER. Patient provided with intermittent oscillations to prevent muscle guarding and relaxation. Normal response to vasopneumatic device upon removal.    Personal Factors and Comorbidities Comorbidity 1    Comorbidities 2 previous neck surgeries.    Examination-Activity Limitations Reach Overhead;Other    Examination-Participation Restrictions Other    Stability/Clinical Decision Making Stable/Uncomplicated    Clinical Decision Making Low    Rehab Potential  Excellent    PT Frequency 2x / week    PT Duration 8 weeks    PT Treatment/Interventions ADLs/Self Care Home Management;Cryotherapy;Electrical Stimulation;Ultrasound;Moist Heat;Therapeutic activities;Therapeutic exercise;Patient/family education;Manual techniques;Vasopneumatic Device;Passive range of motion    PT Next Visit Plan Begin with gentle right shoulder P and PAROM, vasopneumatic on low with pillow between right elbow and thorax.    Consulted and Agree with Plan of Care Patient           Patient will benefit from skilled therapeutic intervention in order to improve the following deficits and impairments:  Pain, Decreased activity tolerance, Increased edema, Decreased range of motion  Visit Diagnosis: Chronic right shoulder pain  Stiffness of right shoulder, not elsewhere classified  Localized edema  Abnormal posture     Problem List Patient Active Problem List   Diagnosis Date Noted  . Psoriasis 01/02/2014    Gabriela Eves, PT, DPT 06/22/2020, 8:23 AM  [facility] [address] [place], [place], [address] Phone: [phone]   Fax:  [phone]  Name: Marvin Clarke MRN: [id number] Date of Birth: 08/21/1956

## 2020-06-22 NOTE — Telephone Encounter (Signed)
Pts wife called to get lab results. Reviewed pts urine culture results with pts wife. Wife voiced understanding but is wondering why no one every called her with the results and wants to know what the next steps are for pt because he has blood in his urine.

## 2020-06-23 ENCOUNTER — Other Ambulatory Visit: Payer: Self-pay

## 2020-06-23 ENCOUNTER — Ambulatory Visit: Payer: Medicare Other | Admitting: Physical Therapy

## 2020-06-23 ENCOUNTER — Encounter: Payer: Self-pay | Admitting: Physical Therapy

## 2020-06-23 DIAGNOSIS — R293 Abnormal posture: Secondary | ICD-10-CM

## 2020-06-23 DIAGNOSIS — G8929 Other chronic pain: Secondary | ICD-10-CM

## 2020-06-23 DIAGNOSIS — M25611 Stiffness of right shoulder, not elsewhere classified: Secondary | ICD-10-CM

## 2020-06-23 DIAGNOSIS — R600 Localized edema: Secondary | ICD-10-CM

## 2020-06-23 NOTE — Therapy (Signed)
[facility] [place], [place], [address] Phone: [phone]   Fax:  [phone]  Physical Therapy Treatment  Patient Details  Name: Marvin Clarke MRN: [id number] Date of Birth: [birth year] Referring Provider (PT): Berline Lopes PA-C   Encounter Date: 06/23/2020   PT End of Session - 06/23/20 0949    Visit Number 4    Number of Visits 16    Date for PT Re-Evaluation 08/10/20    PT Start Time 0949    PT Stop Time 1028    PT Time Calculation (min) 39 min    Activity Tolerance Patient tolerated treatment well    Behavior During Therapy [facility] for tasks assessed/performed           History reviewed. No pertinent past medical history.  Past Surgical History:  Procedure Laterality Date  . ROTATOR CUFF REPAIR Right     There were no vitals filed for this visit.   Subjective Assessment - 06/23/20 0946    Subjective COVID-19 screening performed upon arrival. Patient reports no problems upon arrival.    Pertinent History 2 previous neck surgeries.    Patient Stated Goals Use right UE.    Currently in Pain? No/denies              [facility] PT Assessment - 06/23/20 0001      Assessment   Medical Diagnosis Right shoulder RCR and Biceps tenodesis.    Referring Provider (PT) Berline Lopes PA-C    Hand Dominance Right    Next MD Visit 07/02/2020      Precautions   Precaution Comments Begin with gentle P, PAROM to patient's right shoudler.                         [facility] Adult PT Treatment/Exercise - 06/23/20 0001      Modalities   Modalities Vasopneumatic      Vasopneumatic   Number Minutes Vasopneumatic  10 minutes    Vasopnuematic Location  Shoulder    Vasopneumatic Pressure Low    Vasopneumatic Temperature  34 for edema      Manual Therapy   Manual Therapy Passive ROM    Passive ROM PROM to right shoulder into ER, IR, flexion with gentle holds at end range                       PT Long Term  Goals - 06/15/20 1028      PT LONG TERM GOAL #1   Title Independent with a HEP.    Time 8    Period Weeks    Status New      PT LONG TERM GOAL #2   Title Active right shoulder flexion to 145 degrees so the patient can easily reach overhead.    Time 8    Period Weeks    Status New      PT LONG TERM GOAL #3   Title Active ER to 70 degrees+ to allow for easily donning/doffing of apparel.    Time 8    Period Weeks    Status New      PT LONG TERM GOAL #4   Title Increase right shoulder strength to a solid 4 to 4+/5 to increase stability for performance of functional activities.    Time 8    Period Weeks    Status New      PT LONG TERM GOAL #5   Title Perform ADL's  with pain not > 3/10.    Time 8    Period Weeks    Status New                 Plan - 06/23/20 1104    Clinical Impression Statement Patient presented in clinic with no complaints upon arrival. Firm end feels and smooth arc of motion noted during PROM of R shoulder. Intermittant oscillations provided to reduce muscle guarding. Normal vasopneumatic response noted following removal of the modality.    Personal Factors and Comorbidities Comorbidity 1    Comorbidities 2 previous neck surgeries.    Examination-Activity Limitations Reach Overhead;Other    Examination-Participation Restrictions Other    Stability/Clinical Decision Making Stable/Uncomplicated    Rehab Potential Excellent    PT Frequency 2x / week    PT Duration 8 weeks    PT Treatment/Interventions ADLs/Self Care Home Management;Cryotherapy;Electrical Stimulation;Ultrasound;Moist Heat;Therapeutic activities;Therapeutic exercise;Patient/family education;Manual techniques;Vasopneumatic Device;Passive range of motion    PT Next Visit Plan Begin with gentle right shoulder P and PAROM, vasopneumatic on low with pillow between right elbow and thorax.    Consulted and Agree with Plan of Care Patient           Patient will benefit from skilled  therapeutic intervention in order to improve the following deficits and impairments:  Pain, Decreased activity tolerance, Increased edema, Decreased range of motion  Visit Diagnosis: Chronic right shoulder pain  Stiffness of right shoulder, not elsewhere classified  Localized edema  Abnormal posture     Problem List Patient Active Problem List   Diagnosis Date Noted  . Psoriasis 01/02/2014    Standley Brooking, PTA 06/23/2020, 11:22 AM  [facility] [place], [place], [address] Phone: [phone]   Fax:  [phone]  Name: Marvin Clarke MRN: [id number] Date of Birth: 10/26/1956

## 2020-06-24 ENCOUNTER — Encounter: Payer: BLUE CROSS/BLUE SHIELD | Admitting: Physical Therapy

## 2020-06-25 ENCOUNTER — Encounter: Payer: BLUE CROSS/BLUE SHIELD | Admitting: Physical Therapy

## 2020-06-29 ENCOUNTER — Encounter: Payer: Self-pay | Admitting: Physical Therapy

## 2020-06-29 ENCOUNTER — Ambulatory Visit: Payer: Medicare Other | Admitting: Physical Therapy

## 2020-06-29 DIAGNOSIS — R293 Abnormal posture: Secondary | ICD-10-CM

## 2020-06-29 DIAGNOSIS — G8929 Other chronic pain: Secondary | ICD-10-CM

## 2020-06-29 DIAGNOSIS — M25611 Stiffness of right shoulder, not elsewhere classified: Secondary | ICD-10-CM

## 2020-06-29 DIAGNOSIS — M25511 Pain in right shoulder: Secondary | ICD-10-CM

## 2020-06-29 DIAGNOSIS — R600 Localized edema: Secondary | ICD-10-CM

## 2020-06-29 NOTE — Therapy (Signed)
[facility] [place], [place], [address] Phone: [phone]   Fax:  [phone]  Physical Therapy Treatment  Patient Details  Name: Marvin Clarke MRN: [id number] Date of Birth: [birth year] Referring Provider (PT): Berline Lopes PA-C   Encounter Date: 06/29/2020   PT End of Session - 06/29/20 0802    Visit Number 5    Number of Visits 16    Date for PT Re-Evaluation 08/10/20    PT Start Time 0731    PT Stop Time 0813    PT Time Calculation (min) 42 min    Activity Tolerance Patient tolerated treatment well    Behavior During Therapy [facility] for tasks assessed/performed           History reviewed. No pertinent past medical history.  Past Surgical History:  Procedure Laterality Date   ROTATOR CUFF REPAIR Right     There were no vitals filed for this visit.   Subjective Assessment - 06/29/20 0802    Subjective COVID-19 screening performed upon arrival. Patient reports no new complaints.    Pertinent History 2 previous neck surgeries.    Patient Stated Goals Use right UE.    Currently in Pain? No/denies              [facility] PT Assessment - 06/29/20 0001      Assessment   Medical Diagnosis Right shoulder RCR and Biceps tenodesis.    Referring Provider (PT) Berline Lopes PA-C    Hand Dominance Right    Next MD Visit 07/02/2020      Precautions   Precaution Comments Begin with gentle P, PAROM to patient's right shoudler.                         [facility] Adult PT Treatment/Exercise - 06/29/20 0001      Modalities   Modalities Vasopneumatic      Vasopneumatic   Number Minutes Vasopneumatic  10 minutes    Vasopnuematic Location  Shoulder    Vasopneumatic Pressure Low    Vasopneumatic Temperature  34 for edema      Manual Therapy   Manual Therapy Passive ROM    Passive ROM PROM to right shoulder into ER, IR, abduction, flexion with gentle holds at end range                       PT Long  Term Goals - 06/15/20 1028      PT LONG TERM GOAL #1   Title Independent with a HEP.    Time 8    Period Weeks    Status New      PT LONG TERM GOAL #2   Title Active right shoulder flexion to 145 degrees so the patient can easily reach overhead.    Time 8    Period Weeks    Status New      PT LONG TERM GOAL #3   Title Active ER to 70 degrees+ to allow for easily donning/doffing of apparel.    Time 8    Period Weeks    Status New      PT LONG TERM GOAL #4   Title Increase right shoulder strength to a solid 4 to 4+/5 to increase stability for performance of functional activities.    Time 8    Period Weeks    Status New      PT LONG TERM GOAL #5   Title Perform ADLs  with pain not > 3/10.    Time 8    Period Weeks    Status New                 Plan - 06/29/20 6789    Clinical Impression Statement Patient responded well to therapy session. Smooth arcs of motion noted with PROM to right shoulder. Patient noted with intermittent muscle fasiculations especially with PROM ER. Normal response to modalities upon removal.    Personal Factors and Comorbidities Comorbidity 1    Comorbidities 2 previous neck surgeries.    Examination-Activity Limitations Reach Overhead;Other    Examination-Participation Restrictions Other    Stability/Clinical Decision Making Stable/Uncomplicated    Clinical Decision Making Low    Rehab Potential Excellent    PT Frequency 2x / week    PT Duration 8 weeks    PT Treatment/Interventions ADLs/Self Care Home Management;Cryotherapy;Electrical Stimulation;Ultrasound;Moist Heat;Therapeutic activities;Therapeutic exercise;Patient/family education;Manual techniques;Vasopneumatic Device;Passive range of motion    PT Next Visit Plan MD note next visit; Begin with gentle right shoulder P and PAROM, vasopneumatic on low with pillow between right elbow and thorax.    Consulted and Agree with Plan of Care Patient           Patient will benefit from  skilled therapeutic intervention in order to improve the following deficits and impairments:  Pain, Decreased activity tolerance, Increased edema, Decreased range of motion  Visit Diagnosis: Chronic right shoulder pain  Stiffness of right shoulder, not elsewhere classified  Localized edema  Abnormal posture     Problem List Patient Active Problem List   Diagnosis Date Noted   Psoriasis 01/02/2014    Gabriela Eves, PT, DPT 06/29/2020, [facility] [place], [place], [address] Phone: [phone]   Fax:  [phone]  Name: Marvin Clarke MRN: [id number] Date of Birth: 04-14-1956

## 2020-07-01 ENCOUNTER — Other Ambulatory Visit: Payer: Self-pay

## 2020-07-01 ENCOUNTER — Encounter: Payer: Self-pay | Admitting: Physical Therapy

## 2020-07-01 ENCOUNTER — Ambulatory Visit: Payer: Medicare Other | Admitting: Physical Therapy

## 2020-07-01 DIAGNOSIS — G8929 Other chronic pain: Secondary | ICD-10-CM

## 2020-07-01 DIAGNOSIS — M25611 Stiffness of right shoulder, not elsewhere classified: Secondary | ICD-10-CM

## 2020-07-01 DIAGNOSIS — R600 Localized edema: Secondary | ICD-10-CM

## 2020-07-01 DIAGNOSIS — R293 Abnormal posture: Secondary | ICD-10-CM | POA: Diagnosis not present

## 2020-07-01 DIAGNOSIS — M25511 Pain in right shoulder: Secondary | ICD-10-CM

## 2020-07-01 NOTE — Therapy (Addendum)
[facility] [place], [place], [address] Phone: [phone]   Fax:  [phone]  Physical Therapy Treatment  PHYSICAL THERAPY DISCHARGE SUMMARY  Visits from Start of Care: 6  Current functional level related to goals / functional outcomes: See below   Remaining deficits: See goals   Education / Equipment: HEP Plan: Patient agrees to discharge.  Patient goals were not met. Patient is being discharged due to not returning since the last visit.  ?????  Gabriela Eves, PT, DPT     Patient Details  Name: Marvin Clarke MRN: [id number] Date of Birth: [birth year] Referring Provider (PT): Berline Lopes PA-C   Encounter Date: 07/01/2020   PT End of Session - 07/01/20 0812    Visit Number 6    Number of Visits 16    Date for PT Re-Evaluation 08/10/20    PT Start Time 0730    PT Stop Time 0814    PT Time Calculation (min) 44 min    Activity Tolerance Patient tolerated treatment well    Behavior During Therapy [facility] for tasks assessed/performed           History reviewed. No pertinent past medical history.  Past Surgical History:  Procedure Laterality Date  . ROTATOR CUFF REPAIR Right     There were no vitals filed for this visit.   Subjective Assessment - 07/01/20 0811    Subjective COVID-19 screening performed upon arrival. Patient reported feeling good just a little tightness. No pain.    Pertinent History 2 previous neck surgeries.              [facility] PT Assessment - 07/01/20 0001      Assessment   Medical Diagnosis Right shoulder RCR and Biceps tenodesis.    Referring Provider (PT) Berline Lopes PA-C    Hand Dominance Right    Next MD Visit 07/02/2020      Precautions   Precaution Comments Begin with gentle P, PAROM to patient's right shoudler.      PROM   PROM Assessment Site Shoulder    Right/Left Shoulder Right    Right Shoulder Flexion 158 Degrees    Right Shoulder ABduction 106 Degrees    Right  Shoulder Internal Rotation 65 Degrees    Right Shoulder External Rotation 68 Degrees                         [facility] Adult PT Treatment/Exercise - 07/01/20 0001      Modalities   Modalities Vasopneumatic      Vasopneumatic   Number Minutes Vasopneumatic  15 minutes    Vasopnuematic Location  Shoulder    Vasopneumatic Pressure Low    Vasopneumatic Temperature  34 for edema      Manual Therapy   Manual Therapy Passive ROM    Passive ROM PROM to right shoulder into ER, IR, abduction, flexion with gentle holds at end range                       PT Long Term Goals - 07/01/20 0818      PT LONG TERM GOAL #1   Title Independent with a HEP.    Time 8    Period Weeks    Status Achieved      PT LONG TERM GOAL #2   Title Active right shoulder flexion to 145 degrees so the patient can easily reach overhead.    Time 8  Period Weeks    Status On-going      PT LONG TERM GOAL #3   Title Active ER to 70 degrees+ to allow for easily donning/doffing of apparel.    Time 8    Period Weeks    Status On-going      PT LONG TERM GOAL #4   Title Increase right shoulder strength to a solid 4 to 4+/5 to increase stability for performance of functional activities.    Time 8    Period Weeks    Status On-going      PT LONG TERM GOAL #5   Title Perform ADL's with pain not > 3/10.    Time 8    Period Weeks    Status Achieved                 Plan - 07/01/20 6283    Clinical Impression Statement Patient responded well to therapy session. Intermittent muscle fasciculations noted with PROM but otherwise smooth ranges. Ongoing stiffness at end range but improvements in PROM noted, see objective measures. Normal response to modalities upon removal.    Personal Factors and Comorbidities Comorbidity 1    Comorbidities 2 previous neck surgeries.    Examination-Activity Limitations Reach Overhead;Other    Examination-Participation Restrictions Other     Stability/Clinical Decision Making Stable/Uncomplicated    Rehab Potential Excellent    PT Frequency 2x / week    PT Duration 8 weeks    PT Treatment/Interventions ADLs/Self Care Home Management;Cryotherapy;Electrical Stimulation;Ultrasound;Moist Heat;Therapeutic activities;Therapeutic exercise;Patient/family education;Manual techniques;Vasopneumatic Device;Passive range of motion    PT Next Visit Plan continue gentle right shoulder P and PAROM, vasopneumatic on low with pillow between right elbow and thorax.    Consulted and Agree with Plan of Care Patient           Patient will benefit from skilled therapeutic intervention in order to improve the following deficits and impairments:  Pain, Decreased activity tolerance, Increased edema, Decreased range of motion  Visit Diagnosis: Chronic right shoulder pain  Stiffness of right shoulder, not elsewhere classified  Localized edema  Abnormal posture     Problem List Patient Active Problem List   Diagnosis Date Noted  . Psoriasis 01/02/2014    Gabriela Eves, PT, DPT 07/01/2020, 8:19 AM  [facility] [address] [place], [place], [address] Phone: [phone]   Fax:  [phone]  Name: Marvin Clarke MRN: [id number] Date of Birth: Apr 08, 1956

## 2020-07-03 ENCOUNTER — Telehealth: Payer: Self-pay | Admitting: Nurse Practitioner

## 2020-07-03 NOTE — Telephone Encounter (Signed)
Pt has concerns as he can see blood in his urine, can he bring in a sample or does he ntbs?

## 2020-07-06 NOTE — Telephone Encounter (Signed)
Lmtcb.

## 2020-07-06 NOTE — Telephone Encounter (Signed)
Pt rc for nurse 

## 2020-07-06 NOTE — Telephone Encounter (Signed)
I am not there till Thursday so will have to ask someone else to look at it or NTBS

## 2020-07-09 ENCOUNTER — Ambulatory Visit (INDEPENDENT_AMBULATORY_CARE_PROVIDER_SITE_OTHER): Payer: Medicare Other | Admitting: Nurse Practitioner

## 2020-07-09 ENCOUNTER — Other Ambulatory Visit: Payer: Self-pay

## 2020-07-09 ENCOUNTER — Encounter: Payer: Self-pay | Admitting: Nurse Practitioner

## 2020-07-09 VITALS — BP 159/89 | HR 53 | Temp 97.8°F | Resp 20 | Ht 70.0 in | Wt 225.0 lb

## 2020-07-09 DIAGNOSIS — R319 Hematuria, unspecified: Secondary | ICD-10-CM

## 2020-07-09 DIAGNOSIS — R310 Gross hematuria: Secondary | ICD-10-CM | POA: Diagnosis not present

## 2020-07-09 LAB — URINALYSIS, COMPLETE
Bilirubin, UA: NEGATIVE
Glucose, UA: NEGATIVE
Ketones, UA: NEGATIVE
Leukocytes,UA: NEGATIVE
Nitrite, UA: NEGATIVE
Protein,UA: NEGATIVE
Specific Gravity, UA: 1.015 (ref 1.005–1.030)
Urobilinogen, Ur: 0.2 mg/dL (ref 0.2–1.0)
pH, UA: 7 (ref 5.0–7.5)

## 2020-07-09 LAB — MICROSCOPIC EXAMINATION
Bacteria, UA: NONE SEEN
Epithelial Cells (non renal): NONE SEEN /hpf (ref 0–10)
WBC, UA: NONE SEEN /hpf (ref 0–5)

## 2020-07-09 LAB — HEMOGLOBIN, FINGERSTICK: Hemoglobin: 14.9 g/dL (ref 12.6–17.7)

## 2020-07-09 NOTE — Addendum Note (Signed)
Addended by: Chevis Pretty on: 07/09/2020 10:23 AM   Modules accepted: Orders

## 2020-07-09 NOTE — Addendum Note (Signed)
Addended by: Chevis Pretty on: 07/09/2020 10:21 AM   Modules accepted: Orders

## 2020-07-09 NOTE — Patient Instructions (Signed)
Hematuria, Adult Hematuria is blood in the urine. Blood may be visible in the urine, or it may be identified with a test. This condition can be caused by infections of the bladder, urethra, kidney, or prostate. Other possible causes include:  Kidney stones.  Cancer of the urinary tract.  Too much calcium in the urine.  Conditions that are passed from parent to child (inherited conditions).  Exercise that requires a lot of energy. Infections can usually be treated with medicine, and a kidney stone usually will pass through your urine. If neither of these is the cause of your hematuria, more tests may be needed to identify the cause of your symptoms. It is very important to tell your health care provider about any blood in your urine, even if it is painless or the blood stops without treatment. Blood in the urine, when it happens and then stops and then happens again, can be a symptom of a very serious condition, including cancer. There is no pain in the initial stages of many urinary cancers. Follow these instructions at home: Medicines  Take over-the-counter and prescription medicines only as told by your health care provider.  If you were prescribed an antibiotic medicine, take it as told by your health care provider. Do not stop taking the antibiotic even if you start to feel better. Eating and drinking  Drink enough fluid to keep your urine clear or pale yellow. It is recommended that you drink 3-4 quarts (2.8-3.8 L) a day. If you have been diagnosed with an infection, it is recommended that you drink cranberry juice in addition to large amounts of water.  Avoid caffeine, tea, and carbonated beverages. These tend to irritate the bladder.  Avoid alcohol because it may irritate the prostate (men). General instructions  If you have been diagnosed with a kidney stone, follow your health care provider's instructions about straining your urine to catch the stone.  Empty your bladder  often. Avoid holding urine for long periods of time.  If you are male: ? After a bowel movement, wipe from front to back and use each piece of toilet paper only once. ? Empty your bladder before and after sex.  Pay attention to any changes in your symptoms. Tell your health care provider about any changes or any new symptoms.  It is your responsibility to get your test results. Ask your health care provider, or the department performing the test, when your results will be ready.  Keep all follow-up visits as told by your health care provider. This is important. Contact a health care provider if:  You develop back pain.  You have a fever.  You have nausea or vomiting.  Your symptoms do not improve after 3 days.  Your symptoms get worse. Get help right away if:  You develop severe vomiting and are unable take medicine without vomiting.  You develop severe pain in your back or abdomen even though you are taking medicine.  You pass a large amount of blood in your urine.  You pass blood clots in your urine.  You feel very weak or like you might faint.  You faint. Summary  Hematuria is blood in the urine. It has many possible causes.  It is very important that you tell your health care provider about any blood in your urine, even if it is painless or the blood stops without treatment.  Take over-the-counter and prescription medicines only as told by your health care provider.  Drink enough fluid to keep   your urine clear or pale yellow. This information is not intended to replace advice given to you by your health care provider. Make sure you discuss any questions you have with your health care provider. Document Revised: 03/27/2019 Document Reviewed: 12/03/2016 Elsevier Patient Education  2020 [employer]  

## 2020-07-09 NOTE — Progress Notes (Addendum)
   Subjective:    Patient ID: Marvin Clarke, male    DOB: 1956-02-09, 64 y.o.   MRN: [id number]   Chief Complaint: Hematuria   HPI Marvin Clarke presents today with c/o hematuria for the last month. Says it started about 2 weeks after shoulder surgery. Was seen here 06/17/2020 and urine culture done which was negative. Urine had cleared up but started getting dark brown again last week. Reports he has passed some clots in his urine also. Denies dysuria though does have some discomfort when he passes blood clots. Has had increased urinary urgency for the past year or so and does report incontinence at times.     Review of Systems  Constitutional: Negative.   HENT: Negative.   Eyes: Negative.   Respiratory: Negative.   Cardiovascular: Negative.   Gastrointestinal: Negative.   Genitourinary: Positive for dysuria (when passing blood clots), hematuria and urgency.  Musculoskeletal: Negative.   Skin: Negative.   Neurological: Negative.   Psychiatric/Behavioral: Negative.        Objective:   Physical Exam Vitals reviewed.  Constitutional:      Appearance: He is normal weight.  HENT:     Head: Normocephalic.     Nose: Nose normal.     Mouth/Throat:     Mouth: Mucous membranes are moist.  Cardiovascular:     Rate and Rhythm: Normal rate and regular rhythm.     Pulses: Normal pulses.     Heart sounds: Normal heart sounds.  Pulmonary:     Effort: Pulmonary effort is normal.     Breath sounds: Normal breath sounds.  Abdominal:     General: Bowel sounds are normal.     Palpations: Abdomen is soft.  Musculoskeletal:        General: Normal range of motion.     Cervical back: Normal range of motion.  Skin:    General: Skin is warm and dry.  Neurological:     Mental Status: He is alert and oriented to person, place, and time.  Psychiatric:        Mood and Affect: Mood normal.        Behavior: Behavior normal.   BP (!) 159/89   Pulse (!) 53   Temp 97.8 F (36.6 C) (Temporal)    Resp 20   Ht $R'5\' 10"'kG$  (1.778 m)   Wt 225 lb (102.1 kg)   SpO2 100%   BMI 32.28 kg/m       Assessment & Plan:  Marvin Clarke in today with chief complaint of Hematuria   1. Gross hematuria Force fluids - Urinalysis, Complete - Hemoglobin, fingerstick - Ambulatory referral to Urology - PSA, total and free - [id number]+EGFR    The above assessment and management plan was discussed with the patient. The patient verbalized understanding of and has agreed to the management plan. Patient is aware to call the clinic if symptoms persist or worsen. Patient is aware when to return to the clinic for a follow-up visit. Patient educated on when it is appropriate to go to the emergency department.   Mary-Margaret Hassell Done, FNP

## 2020-07-10 LAB — CMP14+EGFR
ALT: 9 IU/L (ref 0–44)
AST: 14 IU/L (ref 0–40)
Albumin/Globulin Ratio: 1.4 (ref 1.2–2.2)
Albumin: 3.9 g/dL (ref 3.8–4.8)
Alkaline Phosphatase: 68 IU/L (ref 48–121)
BUN/Creatinine Ratio: 13 (ref 10–24)
BUN: 12 mg/dL (ref 8–27)
Bilirubin Total: 0.3 mg/dL (ref 0.0–1.2)
CO2: 25 mmol/L (ref 20–29)
Calcium: 9.3 mg/dL (ref 8.6–10.2)
Chloride: 103 mmol/L (ref 96–106)
Creatinine, Ser: 0.91 mg/dL (ref 0.76–1.27)
GFR calc Af Amer: 102 mL/min/{1.73_m2} (ref >59)
GFR calc non Af Amer: 88 mL/min/{1.73_m2} (ref >59)
Globulin, Total: 2.7 g/dL (ref 1.5–4.5)
Glucose: 93 mg/dL (ref 65–99)
Potassium: 5.1 mmol/L (ref 3.5–5.2)
Sodium: 141 mmol/L (ref 134–144)
Total Protein: 6.6 g/dL (ref 6.0–8.5)

## 2020-07-10 LAB — PSA, TOTAL AND FREE
PSA, Free Pct: 20.9 %
PSA, Free: 0.23 ng/mL
Prostate Specific Ag, Serum: 1.1 ng/mL (ref 0.0–4.0)

## 2020-07-10 NOTE — Telephone Encounter (Signed)
Patient was seen in office today for blood in urine

## 2020-07-15 ENCOUNTER — Other Ambulatory Visit: Payer: Self-pay | Admitting: Urology

## 2020-07-21 ENCOUNTER — Other Ambulatory Visit (HOSPITAL_COMMUNITY)
Admission: RE | Admit: 2020-07-21 | Discharge: 2020-07-21 | Disposition: A | Discharge status: Home / Self Care | Payer: Medicare Other | Source: Ambulatory Visit | Attending: Urology | Admitting: Urology

## 2020-07-21 ENCOUNTER — Other Ambulatory Visit: Payer: Self-pay

## 2020-07-21 ENCOUNTER — Encounter (HOSPITAL_BASED_OUTPATIENT_CLINIC_OR_DEPARTMENT_OTHER): Payer: Self-pay | Admitting: Urology

## 2020-07-21 DIAGNOSIS — Z01812 Encounter for preprocedural laboratory examination: Secondary | ICD-10-CM | POA: Diagnosis present

## 2020-07-21 DIAGNOSIS — Z20822 Contact with and (suspected) exposure to covid-19: Secondary | ICD-10-CM | POA: Insufficient documentation

## 2020-07-21 LAB — SARS CORONAVIRUS 2 (TAT 6-24 HRS): SARS Coronavirus 2: NEGATIVE

## 2020-07-21 NOTE — Progress Notes (Signed)
Spoke w/ via phone for pre-op interview---pt Lab needs dos----     none          Lab results------none COVID test ------07-21-2020 830 am Arrive at -------1245 pm NPO after MN NO Solid Food.  Clear liquids from MN until---1145 am then npo Medications to take morning of surgery -----none Diabetic medication -----n/a Patient Special Instructions -----none Pre-Op special Istructions -----none Patient verbalized understanding of instructions that were given at this phone interview. Patient denies shortness of breath, chest pain, fever, cough at this phone interview.

## 2020-07-23 NOTE — Anesthesia Preprocedure Evaluation (Addendum)
Anesthesia Evaluation  Patient identified by MRN, date of birth, ID band  Reviewed: Allergy & Precautions, NPO status , Patient's Chart, lab work & pertinent test results  History of Anesthesia Complications (+) PONV  Airway Mallampati: II  TM Distance: >3 FB Neck ROM: Full    Dental  (+) Lower Dentures, Upper Dentures   Pulmonary neg pulmonary ROS,    Pulmonary exam normal breath sounds clear to auscultation       Cardiovascular Exercise Tolerance: Good negative cardio ROS Normal cardiovascular exam Rhythm:Regular Rate:Normal     Neuro/Psych negative neurological ROS  negative psych ROS   GI/Hepatic negative GI ROS, Neg liver ROS,   Endo/Other  negative endocrine ROS  Renal/GU negative Renal ROS     Musculoskeletal  (+) Arthritis ,   Abdominal (+) + obese,   Peds  Hematology   Anesthesia Other Findings   Reproductive/Obstetrics                            Anesthesia Physical Anesthesia Plan  ASA: II  Anesthesia Plan: General   Post-op Pain Management:    Induction: Intravenous  PONV Risk Score and Plan: Treatment may vary due to age or medical condition, Scopolamine patch - Pre-op, Ondansetron and Dexamethasone  Airway Management Planned: LMA  Additional Equipment: None  Intra-op Plan:   Post-operative Plan:   Informed Consent: I have reviewed the patients History and Physical, chart, labs and discussed the procedure including the risks, benefits and alternatives for the proposed anesthesia with the patient or authorized representative who has indicated his/her understanding and acceptance.     Dental advisory given  Plan Discussed with:   Anesthesia Plan Comments:        Anesthesia Quick Evaluation

## 2020-07-24 ENCOUNTER — Encounter (HOSPITAL_BASED_OUTPATIENT_CLINIC_OR_DEPARTMENT_OTHER)
Admission: RE | Disposition: A | Discharge status: Home / Self Care | Payer: Self-pay | Source: Home / Self Care | Attending: Urology

## 2020-07-24 ENCOUNTER — Ambulatory Visit (HOSPITAL_BASED_OUTPATIENT_CLINIC_OR_DEPARTMENT_OTHER)
Admission: RE | Admit: 2020-07-24 | Discharge: 2020-07-24 | Disposition: A | Discharge status: Home / Self Care | Payer: Medicare Other | Attending: Urology | Admitting: Urology

## 2020-07-24 ENCOUNTER — Ambulatory Visit (HOSPITAL_BASED_OUTPATIENT_CLINIC_OR_DEPARTMENT_OTHER): Payer: Medicare Other | Admitting: Anesthesiology

## 2020-07-24 ENCOUNTER — Encounter (HOSPITAL_BASED_OUTPATIENT_CLINIC_OR_DEPARTMENT_OTHER): Payer: Self-pay | Admitting: Urology

## 2020-07-24 DIAGNOSIS — L4050 Arthropathic psoriasis, unspecified: Secondary | ICD-10-CM | POA: Insufficient documentation

## 2020-07-24 DIAGNOSIS — D414 Neoplasm of uncertain behavior of bladder: Secondary | ICD-10-CM | POA: Insufficient documentation

## 2020-07-24 DIAGNOSIS — R310 Gross hematuria: Secondary | ICD-10-CM | POA: Diagnosis not present

## 2020-07-24 DIAGNOSIS — Z96653 Presence of artificial knee joint, bilateral: Secondary | ICD-10-CM | POA: Diagnosis not present

## 2020-07-24 DIAGNOSIS — R7303 Prediabetes: Secondary | ICD-10-CM | POA: Diagnosis not present

## 2020-07-24 DIAGNOSIS — M1990 Unspecified osteoarthritis, unspecified site: Secondary | ICD-10-CM | POA: Diagnosis not present

## 2020-07-24 DIAGNOSIS — N329 Bladder disorder, unspecified: Secondary | ICD-10-CM | POA: Diagnosis present

## 2020-07-24 HISTORY — DX: Other specified disorders of bladder: N32.89

## 2020-07-24 HISTORY — PX: TRANSURETHRAL RESECTION OF BLADDER TUMOR: SHX2575

## 2020-07-24 HISTORY — DX: Unspecified osteoarthritis, unspecified site: M19.90

## 2020-07-24 HISTORY — DX: Prediabetes: R73.03

## 2020-07-24 SURGERY — TURBT (TRANSURETHRAL RESECTION OF BLADDER TUMOR)
Anesthesia: General

## 2020-07-24 MED ORDER — GEMCITABINE CHEMO FOR BLADDER INSTILLATION 2000 MG
2000.0000 mg | Freq: Once | INTRAVENOUS | Status: AC
  Administered 2020-07-24: 2000 mg via INTRAVESICAL
  Filled 2020-07-24: qty 2000

## 2020-07-24 MED ORDER — ONDANSETRON HCL 4 MG/2ML IJ SOLN: 4.0000 mg | Freq: Once | INTRAMUSCULAR | Status: DC | PRN

## 2020-07-24 MED ORDER — SCOPOLAMINE 1 MG/3DAYS TD PT72
MEDICATED_PATCH | TRANSDERMAL | Status: AC
  Filled 2020-07-24: qty 1

## 2020-07-24 MED ORDER — ONDANSETRON HCL 4 MG/2ML IJ SOLN
INTRAMUSCULAR | Status: AC
  Filled 2020-07-24: qty 2

## 2020-07-24 MED ORDER — LIDOCAINE 2% (20 MG/ML) 5 ML SYRINGE
INTRAMUSCULAR | Status: DC | PRN
  Administered 2020-07-24: 80 mg via INTRAVENOUS

## 2020-07-24 MED ORDER — MIDAZOLAM HCL 2 MG/2ML IJ SOLN
INTRAMUSCULAR | Status: AC
  Filled 2020-07-24: qty 2

## 2020-07-24 MED ORDER — CEFAZOLIN SODIUM-DEXTROSE 2-4 GM/100ML-% IV SOLN
INTRAVENOUS | Status: AC
  Filled 2020-07-24: qty 100

## 2020-07-24 MED ORDER — FENTANYL CITRATE (PF) 100 MCG/2ML IJ SOLN
INTRAMUSCULAR | Status: DC | PRN
Start: 2020-07-24 — End: 2020-07-24
  Administered 2020-07-24: 50 ug via INTRAVENOUS
  Administered 2020-07-24 (×2): 25 ug via INTRAVENOUS

## 2020-07-24 MED ORDER — OXYCODONE HCL 5 MG/5ML PO SOLN: 5.0000 mg | Freq: Once | ORAL | Status: DC | PRN

## 2020-07-24 MED ORDER — LIDOCAINE 2% (20 MG/ML) 5 ML SYRINGE
INTRAMUSCULAR | Status: AC
  Filled 2020-07-24: qty 5

## 2020-07-24 MED ORDER — PROPOFOL 10 MG/ML IV BOLUS
INTRAVENOUS | Status: DC | PRN
  Administered 2020-07-24: 160 mg via INTRAVENOUS

## 2020-07-24 MED ORDER — FENTANYL CITRATE (PF) 100 MCG/2ML IJ SOLN
INTRAMUSCULAR | Status: AC
  Filled 2020-07-24: qty 2

## 2020-07-24 MED ORDER — LACTATED RINGERS IV SOLN: INTRAVENOUS | Status: DC

## 2020-07-24 MED ORDER — SCOPOLAMINE 1 MG/3DAYS TD PT72
1.0000 | MEDICATED_PATCH | TRANSDERMAL | Status: DC
  Administered 2020-07-24: 1.5 mg via TRANSDERMAL

## 2020-07-24 MED ORDER — ACETAMINOPHEN 10 MG/ML IV SOLN: 1000.0000 mg | Freq: Once | INTRAVENOUS | Status: DC | PRN

## 2020-07-24 MED ORDER — HYDROMORPHONE HCL 1 MG/ML IJ SOLN
0.2500 mg | INTRAMUSCULAR | Status: DC | PRN
  Administered 2020-07-24: 0.25 mg via INTRAVENOUS

## 2020-07-24 MED ORDER — MEPERIDINE HCL 25 MG/ML IJ SOLN: 6.2500 mg | INTRAMUSCULAR | Status: DC | PRN

## 2020-07-24 MED ORDER — PROPOFOL 10 MG/ML IV BOLUS
INTRAVENOUS | Status: AC
  Filled 2020-07-24: qty 20

## 2020-07-24 MED ORDER — CEFAZOLIN SODIUM-DEXTROSE 2-4 GM/100ML-% IV SOLN
2.0000 g | Freq: Once | INTRAVENOUS | Status: AC
  Administered 2020-07-24: 2 g via INTRAVENOUS

## 2020-07-24 MED ORDER — DEXAMETHASONE SODIUM PHOSPHATE 10 MG/ML IJ SOLN
INTRAMUSCULAR | Status: DC | PRN
  Administered 2020-07-24: 10 mg via INTRAVENOUS

## 2020-07-24 MED ORDER — DEXAMETHASONE SODIUM PHOSPHATE 10 MG/ML IJ SOLN
INTRAMUSCULAR | Status: AC
  Filled 2020-07-24: qty 1

## 2020-07-24 MED ORDER — MIDAZOLAM HCL 5 MG/5ML IJ SOLN
INTRAMUSCULAR | Status: DC | PRN
  Administered 2020-07-24: 2 mg via INTRAVENOUS

## 2020-07-24 MED ORDER — BELLADONNA ALKALOIDS-OPIUM 16.2-60 MG RE SUPP
RECTAL | Status: AC
  Filled 2020-07-24: qty 1

## 2020-07-24 MED ORDER — HYDROMORPHONE HCL 1 MG/ML IJ SOLN
INTRAMUSCULAR | Status: AC
  Filled 2020-07-24: qty 1

## 2020-07-24 MED ORDER — ONDANSETRON HCL 4 MG/2ML IJ SOLN
INTRAMUSCULAR | Status: DC | PRN
  Administered 2020-07-24: 4 mg via INTRAVENOUS

## 2020-07-24 MED ORDER — AMISULPRIDE (ANTIEMETIC) 5 MG/2ML IV SOLN: 10.0000 mg | Freq: Once | INTRAVENOUS | Status: DC | PRN

## 2020-07-24 MED ORDER — OXYCODONE-ACETAMINOPHEN 5-325 MG PO TABS: 1.0000 | ORAL_TABLET | ORAL | 0 refills | Status: DC | PRN

## 2020-07-24 MED ORDER — OXYCODONE HCL 5 MG PO TABS: 5.0000 mg | ORAL_TABLET | Freq: Once | ORAL | Status: DC | PRN

## 2020-07-24 MED ORDER — BELLADONNA ALKALOIDS-OPIUM 16.2-60 MG RE SUPP
RECTAL | Status: DC | PRN
Start: 2020-07-24 — End: 2020-07-24
  Administered 2020-07-24: 1 via RECTAL

## 2020-07-24 MED ORDER — DOCUSATE SODIUM 100 MG PO CAPS: 100.0000 mg | ORAL_CAPSULE | Freq: Every day | ORAL | 0 refills | Status: DC | PRN

## 2020-07-24 SURGICAL SUPPLY — 24 items
BAG DRAIN URO-CYSTO SKYTR STRL (DRAIN) ×2 IMPLANT
BAG DRN RND TRDRP ANRFLXCHMBR (UROLOGICAL SUPPLIES)
BAG DRN UROCATH (DRAIN) ×1
BAG URINE DRAIN 2000ML AR STRL (UROLOGICAL SUPPLIES) IMPLANT
BAG URINE LEG 500ML (DRAIN) IMPLANT
CATH COUDE FOLEY 2W 5CC 18FR (CATHETERS) ×2 IMPLANT
CATH FOLEY 2WAY SLVR  5CC 22FR (CATHETERS)
CATH FOLEY 2WAY SLVR 30CC 20FR (CATHETERS) IMPLANT
CATH FOLEY 2WAY SLVR 5CC 22FR (CATHETERS) IMPLANT
CLOTH BEACON ORANGE TIMEOUT ST (SAFETY) ×2 IMPLANT
ELECT REM PT RETURN 9FT ADLT (ELECTROSURGICAL) ×2
ELECTRODE REM PT RTRN 9FT ADLT (ELECTROSURGICAL) ×1 IMPLANT
EVACUATOR MICROVAS BLADDER (UROLOGICAL SUPPLIES) IMPLANT
GLOVE BIO SURGEON STRL SZ7.5 (GLOVE) ×2 IMPLANT
GOWN STRL REUS W/ TWL LRG LVL3 (GOWN DISPOSABLE) ×1 IMPLANT
GOWN STRL REUS W/TWL LRG LVL3 (GOWN DISPOSABLE) ×2
IV NS IRRIG 3000ML ARTHROMATIC (IV SOLUTION) ×2 IMPLANT
KIT TURNOVER CYSTO (KITS) ×2 IMPLANT
LOOP CUT BIPOLAR 24F LRG (ELECTROSURGICAL) ×2 IMPLANT
MANIFOLD NEPTUNE II (INSTRUMENTS) ×2 IMPLANT
PACK CYSTO (CUSTOM PROCEDURE TRAY) ×2 IMPLANT
SYR TOOMEY IRRIG 70ML (MISCELLANEOUS) ×2
SYRINGE TOOMEY IRRIG 70ML (MISCELLANEOUS) ×1 IMPLANT
TUBE CONNECTING 12X1/4 (SUCTIONS) ×2 IMPLANT

## 2020-07-24 NOTE — H&P (Addendum)
Office Visit Report     07/10/2020   --------------------------------------------------------------------------------   Marvin Clarke  MRN: [id number]  DOB: 1956-07-15, 64 year old Male   PRIMARY CARE:  Breck Coons, NP  REFERRING:  Breck Coons, NP  PROVIDER:  Rexene Alberts, M.D.  LOCATION:  [facility] [phone]     --------------------------------------------------------------------------------   CC/HPI: Marvin Clarke is a 64 year old male seen today in consultation for gross hematuria. He states that he has noticed this for the past 6 weeks. He denies history of smoking. He states that this has been painless however he has been passing some clots. He denies dysuria, frequency or urgency. He notes a fair flow stream. He denies a sensation of incomplete bladder emptying. He denies a prior history of prostate cancer or bladder cancer. He denies a family history of urologic malignancies. He denies taking anticoagulation. He has had no recent urinary instrumentation.     ALLERGIES: NKDA    MEDICATIONS: Humira     GU PSH: None   NON-GU PSH: Neck Spinal Fusion Rotator cuff surgery, Right Total Knee Replacement, Bilateral     GU PMH: None   NON-GU PMH: Arthritis GERD    FAMILY HISTORY: 1 Daughter - Runs in Family 1 son - Runs in Family   SOCIAL HISTORY: Marital Status: Married Ethnicity: Not Hispanic Or Latino; Race: White Current Smoking Status: Patient has never smoked.   Tobacco Use Assessment Completed: Used Tobacco in last 30 days? Has never drank.  Drinks 1 caffeinated drink per day.    REVIEW OF SYSTEMS:    GU Review Male:   Patient reports trouble starting your stream, get up at night to urinate, have to strain to urinate , hard to postpone urination, frequent urination, leakage of urine, and stream starts and stops. Patient denies erection problems, penile pain, and burning/ pain with urination.  Gastrointestinal (Upper):   Patient denies  nausea, vomiting, and indigestion/ heartburn.  Gastrointestinal (Lower):   Patient denies diarrhea and constipation.  Constitutional:   Patient denies fever, night sweats, weight loss, and fatigue.  Skin:   Patient reports skin rash/ lesion. Patient denies itching.  Eyes:   Patient denies blurred vision and double vision.  Ears/ Nose/ Throat:   Patient denies sore throat and sinus problems.  Hematologic/Lymphatic:   Patient denies swollen glands and easy bruising.  Cardiovascular:   Patient denies leg swelling and chest pains.  Respiratory:   Patient denies cough and shortness of breath.  Endocrine:   Patient denies excessive thirst.  Musculoskeletal:   Patient denies back pain and joint pain.  Neurological:   Patient denies headaches and dizziness.  Psychologic:   Patient denies depression and anxiety.   VITAL SIGNS:      07/10/2020 11:19 AM  Weight 225 lb / 102.06 kg  Height 71 in / 180.34 cm  BP 119/70 mmHg  Pulse 70 /min  Temperature 97.3 F / 36.2 C  BMI 31.4 kg/m   GU PHYSICAL EXAMINATION:    Scrotum: No lesions. No edema. No cysts. No warts.  Epididymides: Right: no spermatocele, no masses, no cysts, no tenderness, no induration, no enlargement. Left: no spermatocele, no masses, no cysts, no tenderness, no induration, no enlargement.  Testes: No tenderness, no swelling, no enlargement left testes. No tenderness, no swelling, no enlargement right testes. Normal location left testes. Normal location right testes. No mass, no cyst, no varicocele, no hydrocele left testes. No mass, no cyst, no varicocele, no hydrocele right testes.  Urethral Meatus: Normal size. No lesion, no wart, no discharge, no polyp. Normal location.  Penis: Circumcised, no warts, no cracks. No dorsal Peyronie's plaques, no left corporal Peyronie's plaques, no right corporal Peyronie's plaques, no scarring, no warts. No balanitis, no meatal stenosis.  Prostate: 50 gram or 2+ size. Assymetric lobes, right larger  than left, both smooth and soft. No nodule appreciated.   MULTI-SYSTEM PHYSICAL EXAMINATION:    Respiratory: No labored breathing, no use of accessory muscles.   Cardiovascular: Normal temperature, normal extremity pulses, no swelling, no varicosities.  Gastrointestinal: No mass, no tenderness, no rigidity, non obese abdomen.     Complexity of Data:  Source Of History:  Patient, Medical Record Summary  Lab Test Review:   PSA  Records Review:   Previous Doctor Records  Urine Test Review:   Urinalysis   PROCEDURES:         Flexible Cystoscopy - 52000  Risks, benefits, and some of the potential complications of the procedure were discussed at length with the patient including infection, bleeding, voiding discomfort, urinary retention, fever, chills, sepsis, and others. All questions were answered. Informed consent was obtained. Antibiotic prophylaxis of 500mg  cipro was given. Sterile technique and intraurethral analgesia were used.  Meatus:  Normal size. Normal location. Normal condition.  Urethra:  No strictures.  External Sphincter:  Normal.  Verumontanum:  Normal.  Prostate:  Obstructing with kissing lateral lobes. Small intravesical component.  Bladder Neck:  Non-obstructing.  Ureteral Orifices:  Unable to fully visualize given mass at trigone.  Bladder:  Approximately 3cm lesion at the trigone and intravesical component which is frondular with some papillary features, mobile on one stalk.      The lower urinary tract was carefully examined. The procedure was well-tolerated and without complications. Antibiotic instructions were given. Instructions were given to call the office immediately for bloody urine, difficulty urinating, urinary retention, painful or frequent urination, fever, chills, nausea, vomiting or other illness. The patient stated that he understood these instructions and would comply with them.         Urinalysis w/Scope Dipstick Dipstick Cont'd Micro  Color: Yellow  Bilirubin: Neg mg/dL WBC/hpf: NS (Not Seen)  Appearance: Clear Ketones: Neg mg/dL RBC/hpf: 10 - 20/hpf  Specific Gravity: 1.025 Blood: 3+ ery/uL Bacteria: NS (Not Seen)  pH: 5.5 Protein: Neg mg/dL Cystals: NS (Not Seen)  Glucose: Neg mg/dL Urobilinogen: 0.2 mg/dL Casts: NS (Not Seen)    Nitrites: Neg Trichomonas: Not Present    Leukocyte Esterase: Neg leu/uL Mucous: Not Present      Epithelial Cells: NS (Not Seen)      Yeast: NS (Not Seen)      Sperm: Not Present    ASSESSMENT:      ICD-10 Details  1 GU:   Gross hematuria - R31.0   2   Bladder tumor/neoplasm - D41.4   3   Encounter for Prostate Cancer screening - Z12.5    PLAN:           Orders Labs PSA, Urinalysis w/Scope, CULTURE, URINE          Schedule X-Rays: 1 Week - C.T. Hematuria With I.V. Contrast  Return Visit/Planned Activity: 1 Month - Office Visit          Document Letter(s):  Created for Patient: Clinical Summary         Notes:   1. Bladder mass: Cystoscopy today visualize a 3 cm bladder mass on the trigone and involving the intravesical component of the prostate which  appeared to be frondular with some small papillary features. Recommend TURBT with gemcitabine instillation in the operating room. To be scheduled soon. Ordered CT abdomen pelvis hematuria protocol to evaluate the upper tracts.   2. Gross hematuria: Cystoscopy today visualized bladder mass as above. Complete hematuria work-up with CT abdomen pelvis hematuria protocol. This should be scheduled next week prior to his procedure.   3. Prostate cancer screening: Outpatient PSA records reviewed with recent PSA of 1.1. Repeat PSA ordered today. DRE today with asymmetrically enlarged right lobe of the prostate however no nodules.         Next Appointment:      Next Appointment: 08/10/2020 08:45 AM    Appointment Type: 30 Minute Office Visit Established Pt    Location: [facility] [phone]    Provider: Rexene Alberts, M.D.    Reason  for Visit: 1 mo OV- CT Results      Signed by Rexene Alberts, M.D. on 07/13/20 at 9:00 AM (EDT)  Urology Preoperative H&P   Chief Complaint: bladder mass  History of Present Illness: Marvin Clarke is a 64 y.o. male with a bladder mass here for TURBT. No new complaints.   Past Medical History:  Diagnosis Date  . Arthritis    psoriatic arthritis  . Bladder mass   . Borderline diabetes     Past Surgical History:  Procedure Laterality Date  . CERVICAL SPINE SURGERY  2011   [facility]  . HERNIA REPAIR  2000   inguinal hernia  . REPLACEMENT TOTAL KNEE BILATERAL  2014 and 2015  . ROTATOR CUFF REPAIR Right 05/11/2020  . SPINAL FUSION  2017   C 1 to C5 and T 1    Allergies: No Known Allergies  History reviewed. No pertinent family history.  Social History:  reports that he has never smoked. He quit smokeless tobacco use about 41 years ago.  His smokeless tobacco use included chew. He reports that he does not drink alcohol and does not use drugs.  ROS: A complete review of systems was performed.  All systems are negative except for pertinent findings as noted.  Physical Exam:  Vital signs in last 24 hours: Temp:  [98.3 F (36.8 C)] 98.3 F (36.8 C) (09/10 1248) Pulse Rate:  [62] 62 (09/10 1248) Resp:  [22] 22 (09/10 1248) BP: (137)/(94) 137/94 (09/10 1248) SpO2:  [99 %] 99 % (09/10 1248) Weight:  [101.8 kg] 101.8 kg (09/10 1248) Constitutional:  Alert and oriented, No acute distress Cardiovascular: Regular rate and rhythm Respiratory: Normal respiratory effort, Lungs clear bilaterally GI: Abdomen is soft, nontender, nondistended, no abdominal masses GU: No CVA tenderness Lymphatic: No lymphadenopathy Neurologic: Grossly intact, no focal deficits Psychiatric: Normal mood and affect  Laboratory Data:  No results for input(s): WBC, HGB, HCT, PLT in the last 72 hours.  No results for input(s): NA, K, CL, GLUCOSE, BUN, CALCIUM, CREATININE in the last 72  hours.  Invalid input(s): CO3   No results found for this or any previous visit (from the past 24 hour(s)). Recent Results (from the past 240 hour(s))  SARS CORONAVIRUS 2 (TAT 6-24 HRS) Nasopharyngeal Nasopharyngeal Swab     Status: None   Collection Time: 07/21/20  8:10 AM   Specimen: Nasopharyngeal Swab  Result Value Ref Range Status   SARS Coronavirus 2 NEGATIVE NEGATIVE Final    Comment: (NOTE) SARS-CoV-2 target nucleic acids are NOT DETECTED.  The SARS-CoV-2 RNA is generally detectable in upper and lower respiratory  specimens during the acute phase of infection. Negative results do not preclude SARS-CoV-2 infection, do not rule out co-infections with other pathogens, and should not be used as the sole basis for treatment or other patient management decisions. Negative results must be combined with clinical observations, patient history, and epidemiological information. The expected result is Negative.  Fact Sheet for Patients: SugarRoll.be  Fact Sheet for Healthcare Providers: https://www.woods-mathews.com/  This test is not yet approved or cleared by the [place] FDA and  has been authorized for detection and/or diagnosis of SARS-CoV-2 by FDA under an Emergency Use Authorization (EUA). This EUA will remain  in effect (meaning this test can be used) for the duration of the COVID-19 declaration under Se ction 564(b)(1) of the Act, 21 U.S.C. section 360bbb-3(b)(1), unless the authorization is terminated or revoked sooner.  Performed at [facility], [place] [address] [place], [place] [address]     Renal Function: No results for input(s): CREATININE in the last 168 hours. Estimated Creatinine Clearance: 98.3 mL/min (by C-G formula based on SCr of 0.91 mg/dL).  Radiologic Imaging: No results found.  I independently reviewed the above imaging studies.  Assessment and Plan Marvin Clarke is a 64 y.o. male with bladder  mass here for TURBT and instillation of gemcitabine.  Matt R. Eunice Oldaker MD 07/24/2020, [facility] Pager: [phone]): [phone]

## 2020-07-24 NOTE — Transfer of Care (Signed)
Immediate Anesthesia Transfer of Care Note  Patient: Marvin Clarke  Procedure(s) Performed: TRANSURETHRAL RESECTION OF BLADDER TUMOR (TURBT)/ CYSTOSCOPY/ INSTILLATION OF GEMCITABINE (N/A )  Patient Location: PACU  Anesthesia Type:General  Level of Consciousness: awake, alert  and oriented  Airway & Oxygen Therapy: Patient Spontanous Breathing and Patient connected to nasal cannula oxygen  Post-op Assessment: Report given to RN and Post -op Vital signs reviewed and stable  Post vital signs: Reviewed and stable  Last Vitals:  Vitals Value Taken Time  BP 172/102 07/24/20 1426  Temp    Pulse 70 07/24/20 1428  Resp 14 07/24/20 1428  SpO2 96 % 07/24/20 1428  Vitals shown include unvalidated device data.  Last Pain:  Vitals:   07/24/20 1248  TempSrc: Oral  PainSc: 1       Patients Stated Pain Goal: 5 (16/10/96 0454)  Complications: No complications documented.

## 2020-07-24 NOTE — Discharge Instructions (Signed)
   Activity:  You are encouraged to ambulate frequently (about every hour during waking hours) to help prevent blood clots from forming in your legs or lungs.  However, you should not engage in any heavy lifting (> 10-15 lbs), strenuous activity, or straining.   Diet: You should advance your diet as instructed by your physician.  It will be normal to have some bloating, nausea, and abdominal discomfort intermittently.   Prescriptions:  You will be provided a prescription for pain medication to take as needed.  If your pain is not severe enough to require the prescription pain medication, you may take extra strength Tylenol instead which will have less side effects.  You should also take a prescribed stool softener to avoid straining with bowel movements as the prescription pain medication may constipate you.   What to call us about: You should call the office [phone]) if you develop fever > 101 or develop persistent vomiting. Activity:  You are encouraged to ambulate frequently (about every hour during waking hours) to help prevent blood clots from forming in your legs or lungs.  However, you should not engage in any heavy lifting (> 10-15 lbs), strenuous activity, or straining.  Follow-up in the office in 7-10 days to review pathology.   Post Anesthesia Home Care Instructions  Activity: Get plenty of rest for the remainder of the day. A responsible adult should stay with you for 24 hours following the procedure.  For the next 24 hours, DO NOT: -Drive a car -Paediatric nurse -Drink alcoholic beverages -Take any medication unless instructed by your physician -Make any legal decisions or sign important papers.  Meals: Start with liquid foods such as gelatin or soup. Progress to regular foods as tolerated. Avoid greasy, spicy, heavy foods. If nausea and/or vomiting occur, drink only clear liquids until the nausea and/or vomiting subsides. Call your physician if vomiting  continues.  Special Instructions/Symptoms: Your throat may feel dry or sore from the anesthesia or the breathing tube placed in your throat during surgery. If this causes discomfort, gargle with warm salt water. The discomfort should disappear within 24 hours.  If you had a scopolamine patch placed behind your ear for the management of post- operative nausea and/or vomiting:  1. The medication in the patch is effective for 72 hours, after which it should be removed.  Wrap patch in a tissue and discard in the trash. Wash hands thoroughly with soap and water. 2. You may remove the patch earlier than 72 hours if you experience unpleasant side effects which may include dry mouth, dizziness or visual disturbances. 3. Avoid touching the patch. Wash your hands with soap and water after contact with the patch.

## 2020-07-24 NOTE — Anesthesia Postprocedure Evaluation (Signed)
Anesthesia Post Note  Patient: Marvin Clarke  Procedure(s) Performed: TRANSURETHRAL RESECTION OF BLADDER TUMOR (TURBT)/ CYSTOSCOPY/ INSTILLATION OF GEMCITABINE (N/A )     Patient location during evaluation: PACU Anesthesia Type: General Level of consciousness: sedated Pain management: pain level controlled Vital Signs Assessment: post-procedure vital signs reviewed and stable Respiratory status: spontaneous breathing and respiratory function stable Cardiovascular status: stable Postop Assessment: no apparent nausea or vomiting Anesthetic complications: no   No complications documented.  Last Vitals:  Vitals:   07/24/20 1430 07/24/20 1432  BP: (!) 145/92   Pulse: 71 65  Resp: 15 16  Temp: 36.7 C   SpO2: 95% 94%    Last Pain:  Vitals:   07/24/20 1430  TempSrc:   PainSc: 0-No pain                 Merlinda Frederick

## 2020-07-24 NOTE — Anesthesia Procedure Notes (Signed)
Procedure Name: Intubation Date/Time: 07/24/2020 1:53 PM Performed by: Koralyn Prestage D, CRNA Pre-anesthesia Checklist: Patient identified, Emergency Drugs available, Suction available and Patient being monitored Patient Re-evaluated:Patient Re-evaluated prior to induction Oxygen Delivery Method: Circle system utilized Preoxygenation: Pre-oxygenation with 100% oxygen Induction Type: IV induction Ventilation: Mask ventilation without difficulty LMA: LMA inserted LMA Size: 4.0 Tube type: Oral Number of attempts: 1 Placement Confirmation: positive ETCO2 and breath sounds checked- equal and bilateral Tube secured with: Tape Dental Injury: Teeth and Oropharynx as per pre-operative assessment

## 2020-07-24 NOTE — Op Note (Signed)
Operative Note  Preoperative diagnosis:  1.  Bladder mass  Postoperative diagnosis: 1.  Bladder mass  Procedure(s): 1.  Cystoscopy 2. Transurethral resection of bladder tumor, medium 3. Exam under anesthesia 4. Intravesical instillation of gemcitabine (separate procedure)  Surgeon: Rexene Alberts, MD  Assistants:  None  Anesthesia:  General  Complications:  None  EBL:  38ml  Specimens: 1.  ID Type Source Tests Collected by Time Destination  1 : TRIGONE BLADDER TUMOR Tissue PATH GU tumor resection SURGICAL PATHOLOGY Janith Lima, MD 07/24/2020 1405   2 : TRIGONE BLADDER TUMOR DEEP Tissue PATH GU tumor resection SURGICAL PATHOLOGY Janith Lima, MD 07/24/2020 1420    Drains/Catheters: 1.  18 Fr foley  Intraoperative findings:   1. 2cm bladder papillary lesion on a single broad stalk at the trigone/bladder neck, non involving bilateral ureteral orifices. Successfully removed. Excellent hemostasis. 2. Exam under anesthesia without evidence of 3 dimensional mass  Indication:  Marvin Clarke is a 64 y.o. male with a bladder mass concerning for UCB here for TURBT and gemcitabine instillation. After a thorough discussion, including all relevant risks, benefits and alternatives, he is being brought to the operating room for the above procedure.  Description of procedure: The patient was taken to the operating room and general anesthesia was induced.  The patient was placed in the dorsal lithotomy position, prepped and draped in the usual sterile fashion, and preoperative antibiotics were administered. A preoperative time-out was performed.   Cystourethroscopy was performed.  The patient's urethra was examined. He had a narrow meatus approximately 22Fr and we required dilating with urethral sounds to allow passage of resectoscope. He was also found to have wide caliber stenosis that did barely accomodate the 26 Fr resectoscope in the bulbar urethra. He had moderate bilobar  hypertrophy.  The bladder was then systematically examined in its entirety.  He was found to have approximately 2 cm 22 cm papillary lesion along the floor/trigone/bladder neck of the bladder which was a broad stalk.  It did not appear to be muscle invasive.  The mass is not invading the bilateral ureteral orifices. There were no other bladder lesions identified within the bladder.  Using loop cautery resection, the entire tumor was resected and removed for permanent pathologic analysis.   Separate biopsies were also taken of the underlying deep bladder tissue and sent as a separate specimen.   Hemostasis was then achieved with the loop cautery and the bladder was emptied and reinspected with no further bleeding noted at the end of the procedure.    The bladder was then emptied and the procedure ended.  The patient appeared to tolerate the procedure well and without complications.  The patient was able to be awakened and transferred to the recovery unit in satisfactory condition.   As a separate procedure, an 18 Fr Foley catheter was placed in the post anesthesia care unit and 2 g of gemcitabine was instilled to prevent recurrence of bladder lesions.  Instructions were made to keep the Foley catheter clamped for approximately 1 hour and then allow this to drain.  Assuming that the urine remains clear he may discharge home after voiding without a Foley catheter.  I was present and scrubbed for the entirety of the procedure.  Plan: Discharge home after passing void trial in PACU.  Follow-up in office in 7 to 10 days to review pathology.  Matt R. [facility]  Pager: [phone]

## 2020-07-27 ENCOUNTER — Encounter (HOSPITAL_BASED_OUTPATIENT_CLINIC_OR_DEPARTMENT_OTHER): Payer: Self-pay | Admitting: Urology

## 2020-07-27 LAB — SURGICAL PATHOLOGY

## 2020-09-18 ENCOUNTER — Telehealth: Payer: Self-pay | Admitting: Nurse Practitioner

## 2020-09-18 NOTE — Telephone Encounter (Signed)
Pt wanted to

## 2021-09-24 ENCOUNTER — Encounter: Payer: Self-pay | Admitting: Family Medicine

## 2021-09-24 ENCOUNTER — Ambulatory Visit (INDEPENDENT_AMBULATORY_CARE_PROVIDER_SITE_OTHER): Payer: Medicare Other | Admitting: Family Medicine

## 2021-09-24 VITALS — BP 124/74 | HR 65 | Temp 97.7°F | Ht 71.0 in | Wt 231.0 lb

## 2021-09-24 DIAGNOSIS — M5442 Lumbago with sciatica, left side: Secondary | ICD-10-CM | POA: Diagnosis not present

## 2021-09-24 MED ORDER — PREDNISONE 20 MG PO TABS: 40.0000 mg | ORAL_TABLET | Freq: Every day | ORAL | 0 refills | Status: AC

## 2021-09-24 NOTE — Progress Notes (Signed)
Subjective:  Patient ID: Marvin Clarke, male    DOB: 17-Apr-1956, 65 y.o.   MRN: [id number]  Patient Care Team: Chevis Pretty, [place] as PCP - General (Nurse Practitioner)   Chief Complaint:  Back Pain   HPI: Marvin Clarke is a 65 y.o. male presenting on 09/24/2021 for Back Pain   Patient presents today with complaints of lower back pain pain radiating down the left leg.  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  States pain is worse with walking, twisting, bending, and other movement.  He denies saddle anesthesia, loss of bowel or bladder function, weakness, or foot drop.  Back Pain This is a new problem. The current episode started yesterday. The problem occurs constantly. The problem has been waxing and waning since onset. The pain is present in the lumbar spine, gluteal and sacro-iliac. The quality of the pain is described as aching, burning, shooting and stabbing. The pain radiates to the left thigh and left knee. The pain is at a severity of 6/10. The pain is moderate. The symptoms are aggravated by bending, position, standing and twisting. Associated symptoms include paresthesias and tingling. Pertinent negatives include no abdominal pain, bladder incontinence, bowel incontinence, chest pain, dysuria, fever, headaches, leg pain, numbness, paresis, pelvic pain, perianal numbness, weakness or weight loss. He has tried bed rest and analgesics for the symptoms. The treatment provided no relief.   Relevant past medical, surgical, family, and social history reviewed and updated as indicated.  Allergies and medications reviewed and updated. Data reviewed: Chart in Epic.   Past Medical History:  Diagnosis Date   Arthritis    psoriatic arthritis   Bladder mass    Borderline diabetes     Past Surgical History:  Procedure Laterality Date   CERV[facility]INE [facility]  [facility]   HERNIA REPAIR  2000   inguinal hernia    REPLACEMENT TOTAL KNEE BILATERAL  2014 and 2015   ROTATOR CUFF REPAIR Right 05/11/2020   SPINAL FUSION  2017   C 1 to C5 and T 1   TRANSURETHRAL RESECTION OF BLADDER TUMOR N/A 07/24/2020   Procedure: TRANSURETHRAL RESECTION OF BLADDER TUMOR (TURBT)/ CYSTOSCOPY/ INSTILLATION OF GEMCITABINE;  Surgeon: Janith Lima, MD;  Location: [facility];  Service: Urology;  Laterality: N/A;  ONLY NEEDS 30 MIN FOR PROCEDURE    Social History   Socioeconomic History   Marital status: Married    Spouse name: Not on file   Number of children: Not on file   Years of education: Not on file   Highest education level: Not on file  Occupational History   Not on file  Tobacco Use   Smoking status: Never   Smokeless tobacco: Former    Types: Chew    Quit date: 11/14/1978  Vaping Use   Vaping Use: Never used  Substance and Sexual Activity   Alcohol use: No   Drug use: No   Sexual activity: Not on file  Other Topics Concern   Not on file  Social History Narrative   Not on file   Social Determinants of Health   Financial Resource Strain: Not on file  Food Insecurity: Not on file  Transportation Needs: Not on file  Physical Activity: Not on file  Stress: Not on file  Social Connections: Not on file  Intimate Partner Violence: Not on file    Outpatient Encounter Medications as of 09/24/2021  Medication Sig   acetaminophen (TYLENOL) 500 MG tablet Take 500 mg by mouth every 6 (six) hours as needed.   COSENTYX SENSOREADY 300 DOSE 150 MG/ML SOAJ    predniSONE (DELTASONE) 20 MG tablet Take 2 tablets (40 mg total) by mouth daily with breakfast for 5 days.   [DISCONTINUED] docusate sodium (COLACE) 100 MG capsule Take 1 capsule (100 mg total) by mouth daily as needed for up to 30 doses.   [DISCONTINUED] oxyCODONE-acetaminophen (PERCOCET) 5-325 MG tablet Take 1 tablet by mouth every 4 (four) hours as needed for up to 5 doses for severe pain.   [DISCONTINUED] PRESCRIPTION MEDICATION  fluconide .05% daily after shower for posiarris   No facility-administered encounter medications on file as of 09/24/2021.    No Known Allergies  Review of Systems  Constitutional:  Negative for activity change, appetite change, chills, diaphoresis, fatigue, fever, unexpected weight change and weight loss.  HENT: Negative.    Eyes: Negative.   Respiratory:  Negative for cough, chest tightness and shortness of breath.   Cardiovascular:  Negative for chest pain, palpitations and leg swelling.  Gastrointestinal:  Negative for abdominal pain, blood in stool, bowel incontinence, constipation, diarrhea, nausea and vomiting.  Endocrine: Negative.   Genitourinary:  Negative for bladder incontinence, decreased urine volume, difficulty urinating, dysuria, frequency, pelvic pain and urgency.  Musculoskeletal:  Positive for arthralgias, back pain and myalgias. Negative for gait problem, joint swelling, neck pain and neck stiffness.  Skin: Negative.   Allergic/Immunologic: Negative.   Neurological:  Positive for tingling and paresthesias. Negative for dizziness, tremors, seizures, syncope, facial asymmetry, speech difficulty, weakness, light-headedness, numbness and headaches.  Hematological: Negative.   Psychiatric/Behavioral:  Negative for confusion, hallucinations, sleep disturbance and suicidal ideas.   All other systems reviewed and are negative.      Objective:  BP 124/74   Pulse 65   Temp 97.7 F (36.5 C)   Ht 5\' 11"  (1.803 m)   Wt 231 lb (104.8 kg)   SpO2 96%   BMI 32.22 kg/m    Wt Readings from Last 3 Encounters:  09/24/21 231 lb (104.8 kg)  07/24/20 224 lb 6.4 oz (101.8 kg)  07/09/20 225 lb (102.1 kg)    Physical Exam Vitals and nursing note reviewed.  Constitutional:      General: He is not in acute distress.    Appearance: Normal appearance. He is well-developed and well-groomed. He is obese. He is not ill-appearing, toxic-appearing or diaphoretic.  HENT:     Head:  Normocephalic and atraumatic.     Jaw: There is normal jaw occlusion.     Right Ear: Hearing normal.     Left Ear: Hearing normal.     Nose: Nose normal.     Mouth/Throat:     Lips: Pink.     Mouth: Mucous membranes are moist.     Pharynx: Oropharynx is clear. Uvula midline.  Eyes:     General: Lids are normal.     Extraocular Movements: Extraocular movements intact.     Conjunctiva/sclera: Conjunctivae normal.     Pupils: Pupils are equal, round, and reactive to light.  Neck:     Thyroid: No thyroid mass, thyromegaly or thyroid tenderness.     Vascular: No carotid bruit or JVD.     Trachea: Trachea and phonation normal.  Cardiovascular:     Rate and Rhythm: Normal rate and regular rhythm.     Chest Wall: PMI is not displaced.     Pulses: Normal pulses.  Heart sounds: Normal heart sounds. No murmur heard.   No friction rub. No gallop.  Pulmonary:     Effort: Pulmonary effort is normal. No respiratory distress.     Breath sounds: Normal breath sounds. No wheezing.  Abdominal:     General: Bowel sounds are normal. There is no abdominal bruit.     Palpations: Abdomen is soft. There is no hepatomegaly or splenomegaly.  Musculoskeletal:     Cervical back: Normal, normal range of motion and neck supple.     Thoracic back: Normal.     Lumbar back: Tenderness present. No swelling, edema, deformity, lacerations, spasms or bony tenderness. Decreased range of motion. Positive left straight leg raise test. Negative right straight leg raise test. No scoliosis.     Right hip: Normal.     Left hip: Tenderness present. No deformity, lacerations, bony tenderness or crepitus. Decreased range of motion. Normal strength.     Right upper leg: Normal.     Left upper leg: Normal.     Right lower leg: No edema.     Left lower leg: No edema.  Lymphadenopathy:     Cervical: No cervical adenopathy.  Skin:    General: Skin is warm and dry.     Capillary Refill: Capillary refill takes less than 2  seconds.     Coloration: Skin is not cyanotic, jaundiced or pale.     Findings: No rash.  Neurological:     General: No focal deficit present.     Mental Status: He is alert and oriented to person, place, and time.     Sensory: Sensation is intact.     Motor: Motor function is intact.     Coordination: Coordination is intact.     Gait: Gait is intact.     Deep Tendon Reflexes: Reflexes are normal and symmetric.  Psychiatric:        Attention and Perception: Attention and perception normal.        Mood and Affect: Mood and affect normal.        Speech: Speech normal.        Behavior: Behavior normal. Behavior is cooperative.        Thought Content: Thought content normal.        Cognition and Memory: Cognition and memory normal.        Judgment: Judgment normal.    Results for orders placed or performed during the hospital encounter of 07/24/20  Surgical pathology  Result Value Ref Range   SURGICAL PATHOLOGY      SURGICAL PATHOLOGY CASE: [id number] PATIENT: [facility] Surgical Pathology Report     Clinical History: Bladder mass (crm)     FINAL MICROSCOPIC DIAGNOSIS:  A. BLADDER, TRIGONE, TURBT: Inverted urothelial papilloma  B. BLADDER, TRIGONE, DEEP, TURBT: Inverted urothelial papilloma Muscularis propria (detrusor muscle) is present and not involved   GROSS DESCRIPTION:  A: The specimen is received in formalin and consists of a 3.1 x 1.8 x 0.6 cm aggregate of tan-pink, papillary soft tissue.  The specimen is entirely submitted in 2 cassettes.  B: The specimen is received in formalin and consists of a 1.0 x 1.0 x 0.2 cm aggregate of tan-pink soft tissue fragments.  The specimen is entirely submitted in 1 cassette. Craig Staggers 07/27/2020)    Final Diagnosis performed by Casimer Lanius, MD.   Electronically signed 07/27/2020 Technical and / or Professional components performed at [facility], [place] [address] [place] [address], [place], [place] [address].   Immunohis Radio producer  component (if applicable) was performed at [facility]. [address] [place], [place], [place] [address].   IMMUNOHISTOCHEMISTRY DISCLAIMER (if applicable): Some of these immunohistochemical stains may have been developed and the performance characteristics determine by [facility]. Some may not have been cleared or approved by the U.S. Food and Drug Administration. The FDA has determined that such clearance or approval is not necessary. This test is used for clinical purposes. It should not be regarded as investigational or for research. This laboratory is certified under the [place] (CLIA-88) as qualified to perform high complexity clinical laboratory testing.  The controls stained appropriately.        Pertinent labs & imaging results that were available during my care of the patient were reviewed by me and considered in my medical decision making.  Assessment & Plan:  Delois was seen today for back pain.  Diagnoses and all orders for this visit:  Acute bilateral low back pain with left-sided sciatica Low back pain with left sciatica. No indications of acute cauda equina syndrome. Will burst with steroids. Symptomatic care discussed in detail. Report any new, worsening, or persistent symptoms. Follow up in 4-6 weeks or sooner if needed. If not improving, will obtain imaging and refer to PT.  -     predniSONE (DELTASONE) 20 MG tablet; Take 2 tablets (40 mg total) by mouth daily with breakfast for 5 days.    Continue all other maintenance medications.  Follow up plan: Return in about 6 weeks (around 11/05/2021), or if symptoms worsen or fail to improve, for back pain.   Continue healthy lifestyle choices, including diet (rich in fruits, vegetables, and lean proteins, and low in salt and simple carbohydrates) and exercise (at least 30 minutes of moderate physical activity  daily).  Educational handout given for sciatica, lumbosacral radiculopathy  The above assessment and management plan was discussed with the patient. The patient verbalized understanding of and has agreed to the management plan. Patient is aware to call the clinic if they develop any new symptoms or if symptoms persist or worsen. Patient is aware when to return to the clinic for a follow-up visit. Patient educated on when it is appropriate to go to the emergency department.   Monia Pouch, FNP-C [facility] [phone]

## 2022-01-26 ENCOUNTER — Other Ambulatory Visit: Payer: Self-pay

## 2022-01-26 ENCOUNTER — Encounter: Payer: Self-pay | Admitting: Physical Therapy

## 2022-01-26 ENCOUNTER — Ambulatory Visit: Payer: Medicare Other | Attending: Physician Assistant | Admitting: Physical Therapy

## 2022-01-26 DIAGNOSIS — M25612 Stiffness of left shoulder, not elsewhere classified: Secondary | ICD-10-CM | POA: Diagnosis present

## 2022-01-26 DIAGNOSIS — M25512 Pain in left shoulder: Secondary | ICD-10-CM | POA: Insufficient documentation

## 2022-01-26 DIAGNOSIS — G8929 Other chronic pain: Secondary | ICD-10-CM | POA: Insufficient documentation

## 2022-01-26 NOTE — Patient Instructions (Signed)
[place] ?Created by Mali Baya Lentz Mar 15th, 2023 ?View at www.my-exercise-code.com using code: [id number] ?Total 1 Page 1 of 1 ?WAND EXTERNAL ROTATION - SUPINE ER ?Lie on your back holding a cane or wand with both hands. ?On the affected side, place a small rolled up towel or pillow under ?your elbow. Maintain approx. 90 degree bend at the elbow with ?your arm approximately 30-45 degrees away from your side. ?GENTLE. PAIN-FREE ?Use your other arm to pull the wand/cane to rotate the affected ?arm back into a stretch. Hold and then return to starting position ?and then repeat. ?Repeat 10 Times Hold 15 Seconds ?Complete 1 Set Perform 4 Times a Day ?

## 2022-01-26 NOTE — Therapy (Signed)
[place] ?[facility] ?[place] ?[place], [place], [address] ?Phone: [phone]   Fax:  [phone] ? ?Physical Therapy Evaluation ? ?Patient Details  ?Name: Marvin Clarke ?MRN: [id number] ?Date of Birth: December 28, 1955 ?Referring Provider (PT): Corene Cornea Heggerick PA-C ? ? ?Encounter Date: 01/26/2022 ? ? PT End of Session - 01/26/22 1233   ? ? Visit Number 1   ? Number of Visits 12   ? Date for PT Re-Evaluation 03/09/22   ? Authorization Type FOTO AT LEAST EVERY 5TH VISIT.  PROGRESS NOTE AT 10TH VISIT.  KX MODIFIER AFTER 15 VISITS.   ? PT Start Time 0900   ? PT Stop Time 1751   ? PT Time Calculation (min) 45 min   ? Activity Tolerance Patient tolerated treatment well   ? Behavior During Therapy [facility] for tasks assessed/performed   ? ?  ?  ? ?  ? ? ?Past Medical History:  ?Diagnosis Date  ? Arthritis   ? psoriatic arthritis  ? Bladder mass   ? Borderline diabetes   ? ? ?Past Surgical History:  ?Procedure Laterality Date  ? CERVICAL SPINE SURG[facility]  [facility]  ? HERNIA REPAIR  2000  ? inguinal hernia  ? REPLACEMENT TOTAL KNEE BILATERAL  2014 and 2015  ? ROTATOR CUFF REPAIR Right 05/11/2020  ? SPINAL FUSION  2017  ? C 1 to C5 and T 1  ? TRANSURETHRAL RESECTION OF BLADDER TUMOR N/A 07/24/2020  ? Procedure: TRANSURETHRAL RESECTION OF BLADDER TUMOR (TURBT)/ CYSTOSCOPY/ INSTILLATION OF GEMCITABINE;  Surgeon: Janith Lima, MD;  Location: [facility];  Service: Urology;  Laterality: N/A;  ONLY NEEDS 30 MIN FOR PROCEDURE  ? ? ?There were no vitals filed for this visit. ? ? ? Subjective Assessment - 01/26/22 1234   ? ? Subjective COVID-19 screen performed prior to patient entering clinic.  The patient presents to the clinci s/p left shoulder RCR, SAD, DCE and biceps tenodesis performed on 12/30/21.  His pain is rated at ~ a 3/10 today.  He states he can now bee out of the sling.  He is pleased with his progress thus far.  We discussed that we would be following his prescribed  protocol and that he needs to avoid active left shoulder movements at this time.   ? Pertinent History Bilateral TKR's, cervical surgery, right RCR, hernia repair.   ? Patient Stated Goals Use left UE without pain.   ? Currently in Pain? Yes   ? Pain Score 3    ? Pain Location Shoulder   ? Pain Orientation Left   ? Pain Descriptors / Indicators Aching;Dull   ? Pain Type Surgical pain   ? Pain Onset 1 to 4 weeks ago   ? Pain Frequency Intermittent   ? Aggravating Factors  Movement.   ? Pain Relieving Factors Rest.   ? ?  ?  ? ?  ? ? ? ? ? [facility] PT Assessment - 01/26/22 0001   ? ?  ? Assessment  ? Medical Diagnosis S/p left shoulder RCR, SAD, DCE, biceps tenodesis.   ? Referring Provider (PT) Corene Cornea Heggerick PA-C   ? Onset Date/Surgical Date --   12/30/21 (surgery date).  ?  ? Precautions  ? Precaution Comments Please progress per protocol.   ?  ? Restrictions  ? Other Position/Activity Restrictions No left UE weight bearing.   ?  ? Balance Screen  ? Has the patient fallen in the past 6 months  No   ? Has the patient had a decrease in activity level because of a fear of falling?  No   ? Is the patient reluctant to leave their home because of a fear of falling?  No   ?  ? Home Environment  ? Living Environment Private residence   ?  ? Prior Function  ? Level of Independence Independent   ?  ? Observation/Other Assessments  ? Focus on Therapeutic Outcomes (FOTO)  Complete.   ?  ? Observation/Other Assessments-Edema   ? Edema --   Min left shoulder edema.  ?  ? ROM / Strength  ? AROM / PROM / Strength PROM   ?  ? PROM  ? Overall PROM Comments In supine:  Gentle left shoulder passive flexion to 115 degrees and ER to 23 degrees.   ?  ? Palpation  ? Palpation comment Minimal left shoulder tenderness around scope sites.   ? ?  ?  ? ?  ? ? ? ? ? ? ? ? ? ? ? ? ? ?Objective measurements completed on examination: See above findings.  ? ? ? ? ? [facility] Adult PT Treatment/Exercise - 01/26/22 0001   ? ?  ? Modalities  ? Modalities  Vasopneumatic   ?  ? Vasopneumatic  ? Number Minutes Vasopneumatic  15 minutes   ? Vasopnuematic Location  --   Left shoulder with pillow between thorax and elbow.  ? Vasopneumatic Pressure Low   ? ?  ?  ? ?  ? ? ? ? ? ? ? ? ? ? ? ? ? ? ? PT Long Term Goals - 01/26/22 1316   ? ?  ? PT LONG TERM GOAL #1  ? Title Independent with a HEP.   ? Time 6   ? Period Weeks   ? Status New   ?  ? PT LONG TERM GOAL #2  ? Title Active left shoulder flexion to 145 degrees so the patient can easily reach overhead.   ? Time 6   ? Period Weeks   ? Status New   ?  ? PT LONG TERM GOAL #3  ? Title Active ER to 70 degrees+ to allow for easily donning/doffing of apparel.   ? Time 6   ? Period Weeks   ? Status New   ?  ? PT LONG TERM GOAL #4  ? Title Increase left shoulder strength to a solid 4 to 4+/5 to increase stability for performance of functional activities.   ? Time 6   ? Period Weeks   ? Status New   ?  ? PT LONG TERM GOAL #5  ? Title Perform ADL?s with pain not > 3/10.   ? Time 6   ? Period Weeks   ? Status New   ? ?  ?  ? ?  ? ? ? ? ? ? ? ? ? Plan - 01/26/22 1254   ? ? Clinical Impression Statement The patient presents to  OPPT s/p left shoulder SAD, RCR, DCE and biceps tenodesis performed on 12/30/21.  His pain is relatively low at this point.  He is now out of his sling.  As expected he has a loss of range of motion which was assessed passively today per protocol.  He had mild palpable tenderness.  His FOTO limitation score is 66% currently.  Patient will benefit from skilled physical therapy intervention to address pain and deficits.   ? Personal Factors and Comorbidities Comorbidity 1;Comorbidity 2   ?  Comorbidities Bilateral TKR's, cervical surgery, right RCR, hernia repair.   ? Examination-Activity Limitations Other   ? Examination-Participation Restrictions Other   ? Stability/Clinical Decision Making Stable/Uncomplicated   ? Rehab Potential Excellent   ? PT Frequency 2x / week   ? PT Duration 6 weeks   ? PT  Treatment/Interventions Cryotherapy;Electrical Stimulation;Ultrasound;Moist Heat;Therapeutic activities;Therapeutic exercise;Neuromuscular re-education;Manual techniques;Patient/family education;Passive range of motion;Vasopneumatic Device   ? PT Next Visit Plan Progress per protocol:  Begin with PROM to patient's left shoulder.  Vasopnuematic with pillow between thorax and left elbow.   ? Consulted and Agree with Plan of Care Patient   ? ?  ?  ? ?  ? ? ?Patient will benefit from skilled therapeutic intervention in order to improve the following deficits and impairments:  Decreased activity tolerance, Decreased range of motion, Decreased strength, Increased edema, Pain ? ?Visit Diagnosis: ?Chronic left shoulder pain - Plan: PT plan of care cert/re-cert ? ?Stiffness of left shoulder, not elsewhere classified - Plan: PT plan of care cert/re-cert ? ? ? ? ?Problem List ?Patient Active Problem List  ? Diagnosis Date Noted  ? Psoriasis 01/02/2014  ? ? ?Jiovani Mccammon, [place], PT ?01/26/2022, 1:42 PM ? ?[place] ?[facility] ?[place] ?[place], [place], [address] ?Phone: [phone]   Fax:  [phone] ? ?Name: Marvin Clarke ?MRN: [id number] ?Date of Birth: 1956-10-06 ? ? ?

## 2022-01-27 ENCOUNTER — Ambulatory Visit: Payer: Medicare Other | Admitting: *Deleted

## 2022-01-27 ENCOUNTER — Ambulatory Visit: Payer: Medicare Other

## 2022-01-27 DIAGNOSIS — M25512 Pain in left shoulder: Secondary | ICD-10-CM | POA: Diagnosis not present

## 2022-01-27 NOTE — Therapy (Signed)
[place] ?[facility] ?[place] ?[place], [place], [address] ?Phone: [phone]   Fax:  [phone] ? ?Physical Therapy Treatment ? ?Patient Details  ?Name: Marvin Clarke ?MRN: [id number] ?Date of Birth: 16-Jun-1956 ?Referring Provider (PT): Corene Cornea Heggerick PA-C ? ? ?Encounter Date: 01/27/2022 ? ? PT End of Session - 01/27/22 0945   ? ? Visit Number 2   ? Number of Visits 12   ? Date for PT Re-Evaluation 03/09/22   ? Authorization Type FOTO AT LEAST EVERY 5TH VISIT.  PROGRESS NOTE AT 10TH VISIT.  KX MODIFIER AFTER 15 VISITS.   ? PT Start Time 0900   ? PT Stop Time 0950   ? PT Time Calculation (min) 50 min   ? ?  ?  ? ?  ? ? ?Past Medical History:  ?Diagnosis Date  ? Arthritis   ? psoriatic arthritis  ? Bladder mass   ? Borderline diabetes   ? ? ?Past Surgical History:  ?Procedure Laterality Date  ? [facility]  ? HERNIA REPAIR  2000  ? inguinal hernia  ? REPLACEMENT TOTAL KNEE BILATERAL  2014 and 2015  ? ROTATOR CUFF REPAIR Right 05/11/2020  ? SPINAL FUSION  2017  ? C 1 to C5 and T 1  ? TRANSURETHRAL RESECTION OF BLADDER TUMOR N/A 07/24/2020  ? Procedure: TRANSURETHRAL RESECTION OF BLADDER TUMOR (TURBT)/ CYSTOSCOPY/ INSTILLATION OF GEMCITABINE;  Surgeon: Janith Lima, MD;  Location: [facility];  Service: Urology;  Laterality: N/A;  ONLY NEEDS 30 MIN FOR PROCEDURE  ? ? ?There were no vitals filed for this visit. ? ? Subjective Assessment - 01/27/22 0903   ? ? Subjective COVID-19 screen performed prior to patient entering clinic. Did good after last Rx   ? Pertinent History Bilateral TKR's, cervical surgery, right RCR, hernia repair.   ? Patient Stated Goals Use left UE without pain.   ? Currently in Pain? Yes   ? Pain Score 3    ? Pain Location Shoulder   ? Pain Orientation Left   ? Pain Descriptors / Indicators Aching;Dull   ? Pain Type Surgical pain   ? Pain Onset 1 to 4 weeks ago   ? ?  ?  ? ?   ? ? ? ? ? ? ? ? ? ? ? ? ? ? ? ? ? ? ? ? [place] Adult PT Treatment/Exercise - 01/27/22 0001   ? ?  ? Exercises  ? Exercises Shoulder   ?  ? Shoulder Exercises: Supine  ? Other Supine Exercises Reviewed HEP   ?  ? Modalities  ? Modalities Vasopneumatic   ?  ? Vasopneumatic  ? Number Minutes Vasopneumatic  15 minutes   ? Vasopnuematic Location  Shoulder   ? Vasopneumatic Pressure Low   ? Vasopneumatic Temperature  34 for edema   ?  ? Manual Therapy  ? Manual Therapy Passive ROM   ? Passive ROM PROM to LT UE in supine x 25 mins in flexion, Abd., and ER within limits. Pt did great with minimal pain increase.   ? ?  ?  ? ?  ? ? ? ? ? ? ? ? ? ? ? ? ? ? ? PT Long Term Goals - 01/26/22 1316   ? ?  ? PT LONG TERM GOAL #1  ? Title Independent with a HEP.   ? Time 6   ? Period Weeks   ? Status New   ?  ?  PT LONG TERM GOAL #2  ? Title Active left shoulder flexion to 145 degrees so the patient can easily reach overhead.   ? Time 6   ? Period Weeks   ? Status New   ?  ? PT LONG TERM GOAL #3  ? Title Active ER to 70 degrees+ to allow for easily donning/doffing of apparel.   ? Time 6   ? Period Weeks   ? Status New   ?  ? PT LONG TERM GOAL #4  ? Title Increase left shoulder strength to a solid 4 to 4+/5 to increase stability for performance of functional activities.   ? Time 6   ? Period Weeks   ? Status New   ?  ? PT LONG TERM GOAL #5  ? Title Perform ADL?s with pain not > 3/10.   ? Time 6   ? Period Weeks   ? Status New   ? ?  ?  ? ?  ? ? ? ? ? ? ? ? Plan - 01/27/22 0945   ? ? Clinical Impression Statement Pt arrived today doing fairly well with minimal LT shldr pain. Rx focused on PROM to LT shldr for flexion, ER, and ABduction and did well. Vaso end of session for Edema.   ? Personal Factors and Comorbidities Comorbidity 1;Comorbidity 2   ? Comorbidities Bilateral TKR's, cervical surgery, right RCR, hernia repair.   ? Stability/Clinical Decision Making Stable/Uncomplicated   ? Rehab Potential Excellent   ? PT Frequency 2x / week    ? PT Treatment/Interventions Cryotherapy;Electrical Stimulation;Ultrasound;Moist Heat;Therapeutic activities;Therapeutic exercise;Neuromuscular re-education;Manual techniques;Patient/family education;Passive range of motion;Vasopneumatic Device   ? PT Next Visit Plan Progress per protocol:  Begin with PROM to patient's left shoulder.  Vasopnuematic with pillow between thorax and left elbow.   ? Consulted and Agree with Plan of Care Patient   ? ?  ?  ? ?  ? ? ?Patient will benefit from skilled therapeutic intervention in order to improve the following deficits and impairments:  Decreased activity tolerance, Decreased range of motion, Decreased strength, Increased edema, Pain ? ?Visit Diagnosis: ?Chronic left shoulder pain ? ? ? ? ?Problem List ?Patient Active Problem List  ? Diagnosis Date Noted  ? Psoriasis 01/02/2014  ? ? ?Heston Widener,CHRIS, PTA ?01/27/2022, 4:04 PM ? ?[place] ?[facility] ?[place] ?[place], [place], [address] ?Phone: [phone]   Fax:  [phone] ? ?Name: Marvin Clarke ?MRN: [id number] ?Date of Birth: 05/24/1956 ? ? ? ?

## 2022-01-31 ENCOUNTER — Ambulatory Visit: Payer: Medicare Other

## 2022-01-31 ENCOUNTER — Other Ambulatory Visit: Payer: Self-pay

## 2022-01-31 DIAGNOSIS — M25612 Stiffness of left shoulder, not elsewhere classified: Secondary | ICD-10-CM

## 2022-01-31 DIAGNOSIS — G8929 Other chronic pain: Secondary | ICD-10-CM

## 2022-01-31 DIAGNOSIS — M25512 Pain in left shoulder: Secondary | ICD-10-CM | POA: Diagnosis not present

## 2022-01-31 NOTE — Therapy (Signed)
[place] ?[facility] ?[place] ?[place], [place], [address] ?Phone: [phone]   Fax:  [phone] ? ?Physical Therapy Treatment ? ?Patient Details  ?Name: Marvin Clarke ?MRN: [id number] ?Date of Birth: Sep 03, 1956 ?Referring Provider (PT): Corene Cornea Heggerick PA-C ? ? ?Encounter Date: 01/31/2022 ? ? PT End of Session - 01/31/22 0730   ? ? Visit Number 3   ? Number of Visits 12   ? Date for PT Re-Evaluation 03/09/22   ? Authorization Type FOTO AT LEAST EVERY 5TH VISIT.  PROGRESS NOTE AT 10TH VISIT.  KX MODIFIER AFTER 15 VISITS.   ? PT Start Time [phone]   ? PT Stop Time 0813   ? PT Time Calculation (min) 45 min   ? Activity Tolerance Patient tolerated treatment well   ? Behavior During Therapy [facility] for tasks assessed/performed   ? ?  ?  ? ?  ? ? ?Past Medical History:  ?Diagnosis Date  ? Arthritis   ? psoriatic arthritis  ? Bladder mass   ? Borderline diabetes   ? ? ?Past Surgical History:  ?Procedure Laterality Date  ? CERVICAL SPINE SURGERY  [facility]  ? HERNIA REPAIR  2000  ? inguinal hernia  ? REPLACEMENT TOTAL KNEE BILATERAL  2014 and 2015  ? ROTATOR CUFF REPAIR Right 05/11/2020  ? SPINAL FUSION  2017  ? C 1 to C5 and T 1  ? TRANSURETHRAL RESECTION OF BLADDER TUMOR N/A 07/24/2020  ? Procedure: TRANSURETHRAL RESECTION OF BLADDER TUMOR (TURBT)/ CYSTOSCOPY/ INSTILLATION OF GEMCITABINE;  Surgeon: Janith Lima, MD;  Location: [facility];  Service: Urology;  Laterality: N/A;  ONLY NEEDS 30 MIN FOR PROCEDURE  ? ? ?There were no vitals filed for this visit. ? ? Subjective Assessment - 01/31/22 0729   ? ? Subjective COVID-19 screen performed prior to patient entering clinic. Patient reports that his shoulder feels alright this morning. He notes that it hurts a little, but the he did not provide a number. He feels that his motion has improved a lot since his first visit.   ? Pertinent History Bilateral TKR's, cervical surgery, right RCR, hernia repair.   ? Patient  Stated Goals Use left UE without pain.   ? Pain Onset 1 to 4 weeks ago   ? ?  ?  ? ?  ? ? ? ? ? ? ? ? ? ? ? ? ? ? ? ? ? ? ? ? [place] Adult PT Treatment/Exercise - 01/31/22 0001   ? ?  ? Shoulder Exercises: Supine  ? External Rotation PROM;Left;20 reps   with cane  ?  ? Shoulder Exercises: Seated  ? Retraction Both;20 reps   ?  ? Shoulder Exercises: Standing  ? ABduction PROM;Left;20 reps   with arm supported on ball  ?  ? Shoulder Exercises: Pulleys  ? Flexion 5 minutes   for left shoulder PROM  ?  ? Manual Therapy  ? Manual Therapy Joint mobilization;Soft tissue mobilization;Passive ROM   ? Joint Mobilization Glenohumeral grade I-III inferior and posterior   ? Soft tissue mobilization to left rotator cuff   ? Passive ROM all planes to tolerance   ? ?  ?  ? ?  ? ? ? ? ? ? ? ? ? ? ? ? ? ? ? PT Long Term Goals - 01/26/22 1316   ? ?  ? PT LONG TERM GOAL #1  ? Title Independent with a HEP.   ? Time 6   ?  Period Weeks   ? Status New   ?  ? PT LONG TERM GOAL #2  ? Title Active left shoulder flexion to 145 degrees so the patient can easily reach overhead.   ? Time 6   ? Period Weeks   ? Status New   ?  ? PT LONG TERM GOAL #3  ? Title Active ER to 70 degrees+ to allow for easily donning/doffing of apparel.   ? Time 6   ? Period Weeks   ? Status New   ?  ? PT LONG TERM GOAL #4  ? Title Increase left shoulder strength to a solid 4 to 4+/5 to increase stability for performance of functional activities.   ? Time 6   ? Period Weeks   ? Status New   ?  ? PT LONG TERM GOAL #5  ? Title Perform ADL?s with pain not > 3/10.   ? Time 6   ? Period Weeks   ? Status New   ? ?  ?  ? ?  ? ? ? ? ? ? ? ? Plan - 01/31/22 0804   ? ? Clinical Impression Statement Patient was introduced to scapular retraction and passive left shoulder flexion with the pulleys for improved shoulder mobility. He required significant verbal, tactile, and visual cuing with scapular retraction to isolate periscapular engagement while limiting rotator cuff engagement.  Manual therapy focused on improved shoulder mobility through the use of soft tissue mobilization to the rotator cuff and glenohumeral joint mobilizations followed by PROM to tolerance. He reported that his shoulder felt pretty good upon the conclusion of treatment. He continues to require skilled physical therapy to address his remaining impairments to return to his prior level of function.   ? Personal Factors and Comorbidities Comorbidity 1;Comorbidity 2   ? Comorbidities Bilateral TKR's, cervical surgery, right RCR, hernia repair.   ? Stability/Clinical Decision Making Stable/Uncomplicated   ? Rehab Potential Excellent   ? PT Frequency 2x / week   ? PT Treatment/Interventions Cryotherapy;Electrical Stimulation;Ultrasound;Moist Heat;Therapeutic activities;Therapeutic exercise;Neuromuscular re-education;Manual techniques;Patient/family education;Passive range of motion;Vasopneumatic Device   ? PT Next Visit Plan Progress per protocol:  Begin with PROM to patient's left shoulder.  Vasopnuematic with pillow between thorax and left elbow.   ? Consulted and Agree with Plan of Care Patient   ? ?  ?  ? ?  ? ? ?Patient will benefit from skilled therapeutic intervention in order to improve the following deficits and impairments:  Decreased activity tolerance, Decreased range of motion, Decreased strength, Increased edema, Pain ? ?Visit Diagnosis: ?Chronic left shoulder pain ? ?Stiffness of left shoulder, not elsewhere classified ? ? ? ? ?Problem List ?Patient Active Problem List  ? Diagnosis Date Noted  ? Psoriasis 01/02/2014  ? ? ?Darlin Coco, PT ?01/31/2022, 8:21 AM ? ?[place] ?[facility] ?[place] ?[place], [place], [address] ?Phone: [phone]   Fax:  [phone] ? ?Name: Marvin Clarke ?MRN: [id number] ?Date of Birth: [birth year] ? ? ? ?

## 2022-02-02 ENCOUNTER — Other Ambulatory Visit: Payer: Self-pay

## 2022-02-02 ENCOUNTER — Ambulatory Visit: Payer: Medicare Other

## 2022-02-02 DIAGNOSIS — M25612 Stiffness of left shoulder, not elsewhere classified: Secondary | ICD-10-CM

## 2022-02-02 DIAGNOSIS — M25512 Pain in left shoulder: Secondary | ICD-10-CM | POA: Diagnosis not present

## 2022-02-02 DIAGNOSIS — G8929 Other chronic pain: Secondary | ICD-10-CM

## 2022-02-02 NOTE — Therapy (Signed)
[place] ?[facility] ?[place] ?[place], [place], [address] ?Phone: [phone]   Fax:  [phone] ? ?Physical Therapy Treatment ? ?Patient Details  ?Name: Marvin Clarke ?MRN: [id number] ?Date of Birth: [birth year] ?Referring Provider (PT): Corene Cornea Heggerick PA-C ? ? ?Encounter Date: 02/02/2022 ? ? PT End of Session - 02/02/22 0727   ? ? Visit Number 4   ? Number of Visits 12   ? Date for PT Re-Evaluation 03/09/22   ? Authorization Type FOTO AT LEAST EVERY 5TH VISIT.  PROGRESS NOTE AT 10TH VISIT.  KX MODIFIER AFTER 15 VISITS.   ? PT Start Time 0730   ? PT Stop Time 0811   ? PT Time Calculation (min) 41 min   ? Activity Tolerance Patient tolerated treatment well   ? Behavior During Therapy [facility] for tasks assessed/performed   ? ?  ?  ? ?  ? ? ?Past Medical History:  ?Diagnosis Date  ? Arthritis   ? psoriatic arthritis  ? Bladder mass   ? Borderline diabetes   ? ? ?Past Surgical History:  ?Procedure Laterality Date  ? CERVICAL SPINE SURGERY  [facility]  ? HERNIA REPAIR  2000  ? inguinal hernia  ? REPLACEMENT TOTAL KNEE BILATERAL  2014 and 2015  ? ROTATOR CUFF REPAIR Right 05/11/2020  ? SPINAL FUSION  2017  ? C 1 to C5 and T 1  ? TRANSURETHRAL RESECTION OF BLADDER TUMOR N/A 07/24/2020  ? Procedure: TRANSURETHRAL RESECTION OF BLADDER TUMOR (TURBT)/ CYSTOSCOPY/ INSTILLATION OF GEMCITABINE;  Surgeon: Janith Lima, MD;  Location: [facility];  Service: Urology;  Laterality: N/A;  ONLY NEEDS 30 MIN FOR PROCEDURE  ? ? ?There were no vitals filed for this visit. ? ? Subjective Assessment - 02/02/22 0729   ? ? Subjective COVID-19 screen performed prior to patient entering clinic. He feels that his shoulder is getting better and better after each appointment. He notes that he is able to put his hand on his steering wheel now. However, when informed that he should not be doing any reaching with his arm until 6 weeks from surgery he reported that he wasn't.   ? Pertinent  History Bilateral TKR's, cervical surgery, right RCR, hernia repair.   ? Patient Stated Goals Use left UE without pain.   ? Currently in Pain? No/denies   ? Pain Onset 1 to 4 weeks ago   ? ?  ?  ? ?  ? ? ? ? ? ? ? ? ? ? ? ? ? ? ? ? ? ? ? ? [place] Adult PT Treatment/Exercise - 02/02/22 0001   ? ?  ? Shoulder Exercises: Supine  ? External Rotation PROM;Left   2.5 minutes  ?  ? Shoulder Exercises: Standing  ? Flexion PROM;Both   with ball; 2 minutes  ? Retraction Both;20 reps   ?  ? Shoulder Exercises: Pulleys  ? Flexion 5 minutes   for left shoulder PROM  ?  ? Manual Therapy  ? Manual Therapy Joint mobilization;Soft tissue mobilization;Passive ROM   ? Joint Mobilization Glenohumeral grade I-III inferior and posterior   ? Soft tissue mobilization to left rotator cuff   ? Passive ROM all planes to tolerance with gentle hold at end range   ? ?  ?  ? ?  ? ? ? ? ? ? ? ? ? ? ? ? ? ? ? PT Long Term Goals - 01/26/22 1316   ? ?  ?  PT LONG TERM GOAL #1  ? Title Independent with a HEP.   ? Time 6   ? Period Weeks   ? Status New   ?  ? PT LONG TERM GOAL #2  ? Title Active left shoulder flexion to 145 degrees so the patient can easily reach overhead.   ? Time 6   ? Period Weeks   ? Status New   ?  ? PT LONG TERM GOAL #3  ? Title Active ER to 70 degrees+ to allow for easily donning/doffing of apparel.   ? Time 6   ? Period Weeks   ? Status New   ?  ? PT LONG TERM GOAL #4  ? Title Increase left shoulder strength to a solid 4 to 4+/5 to increase stability for performance of functional activities.   ? Time 6   ? Period Weeks   ? Status New   ?  ? PT LONG TERM GOAL #5  ? Title Perform ADL?s with pain not > 3/10.   ? Time 6   ? Period Weeks   ? Status New   ? ?  ?  ? ?  ? ? ? ? ? ? ? ? Plan - 02/02/22 0729   ? ? Clinical Impression Statement Treatment focused on familiar interventions for improved shoulder mobility while remaining within his surgical protocol. He experienced muscle guarding with PROM initially with abduction being the  most limited. However, this steadily improved with light glenohumeral joint mobilizations and soft tissue mobilization to the rotator cuff. He reported that his shoulder felt a little tired, but good upon the conclusion of treatment. He continues to require skilled physical therapy to address his remaining impairments to return to his prior level of function.   ? Personal Factors and Comorbidities Comorbidity 1;Comorbidity 2   ? Comorbidities Bilateral TKR's, cervical surgery, right RCR, hernia repair.   ? Stability/Clinical Decision Making Stable/Uncomplicated   ? Rehab Potential Excellent   ? PT Frequency 2x / week   ? PT Treatment/Interventions Cryotherapy;Electrical Stimulation;Ultrasound;Moist Heat;Therapeutic activities;Therapeutic exercise;Neuromuscular re-education;Manual techniques;Patient/family education;Passive range of motion;Vasopneumatic Device   ? PT Next Visit Plan Progress per protocol:  Begin with PROM to patient's left shoulder.  Vasopnuematic with pillow between thorax and left elbow.   ? Consulted and Agree with Plan of Care Patient   ? ?  ?  ? ?  ? ? ?Patient will benefit from skilled therapeutic intervention in order to improve the following deficits and impairments:  Decreased activity tolerance, Decreased range of motion, Decreased strength, Increased edema, Pain ? ?Visit Diagnosis: ?Chronic left shoulder pain ? ?Stiffness of left shoulder, not elsewhere classified ? ? ? ? ?Problem List ?Patient Active Problem List  ? Diagnosis Date Noted  ? Psoriasis 01/02/2014  ? ? ?Darlin Coco, PT ?02/02/2022, 8:14 AM ? ?[place] ?[facility] ?[place] ?[place], [place], [address] ?Phone: [phone]   Fax:  [phone] ? ?Name: KRISHAY FARO ?MRN: [id number] ?Date of Birth: Feb 04, 1956 ? ? ? ?

## 2022-02-08 ENCOUNTER — Other Ambulatory Visit: Payer: Self-pay

## 2022-02-08 ENCOUNTER — Ambulatory Visit: Payer: Medicare Other

## 2022-02-08 DIAGNOSIS — G8929 Other chronic pain: Secondary | ICD-10-CM

## 2022-02-08 DIAGNOSIS — M25612 Stiffness of left shoulder, not elsewhere classified: Secondary | ICD-10-CM

## 2022-02-08 DIAGNOSIS — M25512 Pain in left shoulder: Secondary | ICD-10-CM | POA: Diagnosis not present

## 2022-02-08 NOTE — Therapy (Signed)
[place] ?[facility] ?[place] ?[place], [place], [address] ?Phone: [phone]   Fax:  [phone] ? ?Physical Therapy Treatment ? ?Patient Details  ?Name: Marvin Clarke ?MRN: [id number] ?Date of Birth: 01-30-1956 ?Referring Provider (PT): Corene Cornea Heggerick PA-C ? ? ?Encounter Date: 02/08/2022 ? ? PT End of Session - 02/08/22 0730   ? ? Visit Number 5   ? Number of Visits 12   ? Date for PT Re-Evaluation 03/09/22   ? Authorization Type FOTO AT LEAST EVERY 5TH VISIT.  PROGRESS NOTE AT 10TH VISIT.  KX MODIFIER AFTER 15 VISITS.   ? PT Start Time [phone]   ? PT Stop Time 0812   ? PT Time Calculation (min) 44 min   ? Activity Tolerance Patient tolerated treatment well   ? Behavior During Therapy [facility] for tasks assessed/performed   ? ?  ?  ? ?  ? ? ?Past Medical History:  ?Diagnosis Date  ? Arthritis   ? psoriatic arthritis  ? Bladder mass   ? Borderline diabetes   ? ? ?Past Surgical History:  ?Procedure Laterality Date  ? CERVICAL SPINE SURGERY  [facility]  [facility]  ? HERNIA REPAIR  2000  ? inguinal hernia  ? REPLACEMENT TOTAL KNEE BILATERAL  2014 and 2015  ? ROTATOR CUFF REPAIR Right 05/11/2020  ? SPINAL FUSION  2017  ? C 1 to C5 and T 1  ? TRANSURETHRAL RESECTION OF BLADDER TUMOR N/A 07/24/2020  ? Procedure: TRANSURETHRAL RESECTION OF BLADDER TUMOR (TURBT)/ CYSTOSCOPY/ INSTILLATION OF GEMCITABINE;  Surgeon: Janith Lima, MD;  Location: [facility];  Service: Urology;  Laterality: N/A;  ONLY NEEDS 30 MIN FOR PROCEDURE  ? ? ?There were no vitals filed for this visit. ? ? Subjective Assessment - 02/08/22 0729   ? ? Subjective COVID-19 screen performed prior to patient entering clinic. Patient reports that his arm feels about the same today. He has some pain when trying to reach up with his left arm to put it on the top of his steering wheel.   ? Pertinent History Bilateral TKR's, cervical surgery, right RCR, hernia repair.   ? Patient Stated Goals Use left UE without pain.    ? Currently in Pain? Yes   ? Pain Score 3    ? Pain Location Shoulder   ? Pain Orientation Left   ? Pain Onset 1 to 4 weeks ago   ? ?  ?  ? ?  ? ? ? ? ? [facility] PT Assessment - 02/08/22 0001   ? ?  ? Assessment  ? Next MD Visit 3/28   ?  ? Observation/Other Assessments  ? Focus on Therapeutic Outcomes (FOTO)  43.42   ?  ? PROM  ? PROM Assessment Site Shoulder   ? Right/Left Shoulder Left   ? Left Shoulder Flexion 150 Degrees   ? Left Shoulder ABduction 124 Degrees   ? Left Shoulder External Rotation 25 Degrees   limited by muscle guarding and patient required multiple cues to prevent AROM  ?  ? Palpation  ? Palpation comment left upper trapezius: minimal tenderness to palpation   ? ?  ?  ? ?  ? ? ? ? ? ? ? ? ? ? ? ? ? ? ? ? [place] Adult PT Treatment/Exercise - 02/08/22 0001   ? ?  ? Shoulder Exercises: Standing  ? Retraction Left;20 reps;Theraband   ? Theraband Level (Shoulder Retraction) Level 3 (Green)   ?  ?  Shoulder Exercises: Pulleys  ? Flexion 5 minutes   ? ABduction 3 minutes   ?  ? Shoulder Exercises: Isometric Strengthening  ? ADduction Other (comment)   20 reps; 5 second hold; 50% max effort  ?  ? Manual Therapy  ? Manual Therapy Joint mobilization;Soft tissue mobilization;Passive ROM   ? Joint Mobilization Glenohumeral grade I-III inferior and posterior   ? Soft tissue mobilization to left rotator cuff and upper trapezius   ? Passive ROM all planes to tolerance with gentle hold at end range   ? ?  ?  ? ?  ? ? ? ? ? ? ? ? ? ? ? ? ? ? ? PT Long Term Goals - 01/26/22 1316   ? ?  ? PT LONG TERM GOAL #1  ? Title Independent with a HEP.   ? Time 6   ? Period Weeks   ? Status New   ?  ? PT LONG TERM GOAL #2  ? Title Active left shoulder flexion to 145 degrees so the patient can easily reach overhead.   ? Time 6   ? Period Weeks   ? Status New   ?  ? PT LONG TERM GOAL #3  ? Title Active ER to 70 degrees+ to allow for easily donning/doffing of apparel.   ? Time 6   ? Period Weeks   ? Status New   ?  ? PT LONG TERM  GOAL #4  ? Title Increase left shoulder strength to a solid 4 to 4+/5 to increase stability for performance of functional activities.   ? Time 6   ? Period Weeks   ? Status New   ?  ? PT LONG TERM GOAL #5  ? Title Perform ADL?s with pain not > 3/10.   ? Time 6   ? Period Weeks   ? Status New   ? ?  ?  ? ?  ? ? ? ? ? ? ? ? Plan - 02/08/22 0730   ? ? Clinical Impression Statement Patient was introduced to light isometric adduction in addition to other new interventions for improved left shoulder mobility. He required moderate cuing with resisted scapular retraction to prevent elbow extension to isolate his scapular stabilizers. Manual therapy focused on soft tissue mobilization and glenohumeral joint mobilizations followed by PROM for improved ROM. His PROM was reassessed and he was able to demonstrate significant flexion PROM compared to his initial evaluation. He reported that his shoulder felt a little better upon the conclusion of treatment. He continues to require skilled physical therapy to address his remaining impairments to return to his prior level of function.   ? Personal Factors and Comorbidities Comorbidity 1;Comorbidity 2   ? Comorbidities Bilateral TKR's, cervical surgery, right RCR, hernia repair.   ? Stability/Clinical Decision Making Stable/Uncomplicated   ? Rehab Potential Excellent   ? PT Frequency 2x / week   ? PT Treatment/Interventions Cryotherapy;Electrical Stimulation;Ultrasound;Moist Heat;Therapeutic activities;Therapeutic exercise;Neuromuscular re-education;Manual techniques;Patient/family education;Passive range of motion;Vasopneumatic Device   ? PT Next Visit Plan Progress per protocol:  Begin with PROM to patient's left shoulder.  Vasopnuematic with pillow between thorax and left elbow.   ? Consulted and Agree with Plan of Care Patient   ? ?  ?  ? ?  ? ? ?Patient will benefit from skilled therapeutic intervention in order to improve the following deficits and impairments:  Decreased  activity tolerance, Decreased range of motion, Decreased strength, Increased edema, Pain ? ?Visit Diagnosis: ?Chronic left shoulder  pain ? ?Stiffness of left shoulder, not elsewhere classified ? ? ? ? ?Problem List ?Patient Active Problem List  ? Diagnosis Date Noted  ? Psoriasis 01/02/2014  ? ? ?Darlin Coco, PT ?02/08/2022, 8:57 AM ? ?[place] ?[facility] ?[place] ?[place], [place], [address] ?Phone: [phone]   Fax:  [phone] ? ?Name: Marvin Clarke ?MRN: [id number] ?Date of Birth: 1956-05-30 ? ? ? ?

## 2022-04-29 ENCOUNTER — Ambulatory Visit (INDEPENDENT_AMBULATORY_CARE_PROVIDER_SITE_OTHER): Payer: Medicare Other | Admitting: Family Medicine

## 2022-04-29 ENCOUNTER — Encounter: Payer: Self-pay | Admitting: Family Medicine

## 2022-04-29 VITALS — BP 125/72 | HR 60 | Temp 98.8°F | Ht 71.0 in | Wt 227.0 lb

## 2022-04-29 DIAGNOSIS — L237 Allergic contact dermatitis due to plants, except food: Secondary | ICD-10-CM | POA: Diagnosis not present

## 2022-04-29 DIAGNOSIS — H5789 Other specified disorders of eye and adnexa: Secondary | ICD-10-CM

## 2022-04-29 MED ORDER — OLOPATADINE HCL 0.1 % OP SOLN: 1.0000 [drp] | Freq: Two times a day (BID) | OPHTHALMIC | 12 refills | Status: DC

## 2022-04-29 MED ORDER — METHYLPREDNISOLONE ACETATE 80 MG/ML IJ SUSP
60.0000 mg | Freq: Once | INTRAMUSCULAR | Status: AC
  Administered 2022-04-29: 60 mg via INTRAMUSCULAR

## 2022-04-29 MED ORDER — PREDNISONE 20 MG PO TABS
40.0000 mg | ORAL_TABLET | Freq: Every day | ORAL | 0 refills | Status: AC
Start: 2022-04-29 — End: 2022-05-04

## 2022-04-29 MED ORDER — PREDNISONE 20 MG PO TABS: 40.0000 mg | ORAL_TABLET | Freq: Every day | ORAL | 0 refills | Status: DC

## 2022-04-29 MED ORDER — OLOPATADINE HCL 0.1 % OP SOLN: 1.0000 [drp] | Freq: Two times a day (BID) | OPHTHALMIC | 12 refills | Status: AC

## 2022-04-29 NOTE — Progress Notes (Signed)
Subjective:  Patient ID: Marvin Clarke, male    DOB: 10-18-1956, 66 y.o.   MRN: [id number]  Patient Care Team: Chevis Pretty, FNP as PCP - General (Nurse Practitioner)   Chief Complaint:  Facial Swelling   HPI: Marvin Clarke is a 66 y.o. male presenting on 04/29/2022 for Facial Swelling   Pt presents today for bilateral eye irritation and rash around eyes. States this started after cutting some trees. He was wearing safety goggles and does not recall getting anything in his eyes. He has not tried anything for the symptoms. No visual changes. Denies eye discharge. No fever, chills, weakness, confusion, or headaches.     Relevant past medical, surgical, family, and social history reviewed and updated as indicated.  Allergies and medications reviewed and updated. Data reviewed: Chart in Epic.   Past Medical History:  Diagnosis Date   Arthritis    psoriatic arthritis   Bladder mass    Borderline diabetes     Past Surgical History:  Procedure Laterality Date   CERVICAL SPINE SURGERY  [facility]   [facility]   HERNIA REPAIR  2000   inguinal hernia   REPLACEMENT TOTAL KNEE BILATERAL  2014 and 2015   ROTATOR CUFF REPAIR Right 05/11/2020   SPINAL FUSION  2017   C 1 to C5 and T 1   TRANSURETHRAL RESECTION OF BLADDER TUMOR N/A 07/24/2020   Procedure: TRANSURETHRAL RESECTION OF BLADDER TUMOR (TURBT)/ CYSTOSCOPY/ INSTILLATION OF GEMCITABINE;  Surgeon: Janith Lima, MD;  Location: [facility];  Service: Urology;  Laterality: N/A;  ONLY NEEDS 30 MIN FOR PROCEDURE    Social History   Socioeconomic History   Marital status: Married    Spouse name: Not on file   Number of children: Not on file   Years of education: Not on file   Highest education level: Not on file  Occupational History   Not on file  Tobacco Use   Smoking status: Never   Smokeless tobacco: Former    Types: Chew    Quit date: 11/14/1978  Vaping Use   Vaping Use: Never used  Substance and  Sexual Activity   Alcohol use: No   Drug use: No   Sexual activity: Not on file  Other Topics Concern   Not on file  Social History Narrative   Not on file   Social Determinants of Health   Financial Resource Strain: Not on file  Food Insecurity: Not on file  Transportation Needs: Not on file  Physical Activity: Not on file  Stress: Not on file  Social Connections: Not on file  Intimate Partner Violence: Not on file    Outpatient Encounter Medications as of 04/29/2022  Medication Sig   acetaminophen (TYLENOL) 500 MG tablet Take 500 mg by mouth every 6 (six) hours as needed.   aluminum hydroxide-magnesium carbonate (GAVISCON) 95-358 MG/15ML SUSP Take by mouth.   gabapentin (NEURONTIN) 300 MG capsule Take by mouth.   Risankizumab-rzaa (SKYRIZI PEN [place]) Inject into the skin.   [DISCONTINUED] olopatadine (PATADAY) 0.1 % ophthalmic solution Place 1 drop into both eyes 2 (two) times daily.   [DISCONTINUED] predniSONE (DELTASONE) 20 MG tablet Take 2 tablets (40 mg total) by mouth daily with breakfast for 5 days.   olopatadine (PATADAY) 0.1 % ophthalmic solution Place 1 drop into both eyes 2 (two) times daily.   predniSONE (DELTASONE) 20 MG tablet Take 2 tablets (40 mg total) by mouth daily with breakfast for 5 days.   [  DISCONTINUED] COSENTYX SENSOREADY 300 DOSE 150 MG/ML SOAJ    No facility-administered encounter medications on file as of 04/29/2022.    No Known Allergies  Review of Systems  Constitutional:  Negative for activity change, appetite change, chills, diaphoresis, fatigue, fever and unexpected weight change.  HENT: Negative.    Eyes:  Positive for redness and itching. Negative for photophobia, pain, discharge and visual disturbance.  Respiratory:  Negative for cough, chest tightness and shortness of breath.   Cardiovascular:  Negative for chest pain, palpitations and leg swelling.  Gastrointestinal:  Negative for abdominal pain, blood in stool, constipation, diarrhea,  nausea and vomiting.  Endocrine: Negative.   Genitourinary:  Negative for decreased urine volume, difficulty urinating, dysuria, frequency and urgency.  Musculoskeletal:  Negative for arthralgias and myalgias.  Skin:  Positive for color change and rash.  Allergic/Immunologic: Negative.   Neurological:  Negative for dizziness, weakness and headaches.  Hematological: Negative.   Psychiatric/Behavioral:  Negative for confusion, hallucinations, sleep disturbance and suicidal ideas.   All other systems reviewed and are negative.       Objective:  BP 125/72   Pulse 60   Temp 98.8 F (37.1 C)   Ht '5\' 11"'$  (1.803 m)   Wt 227 lb (103 kg)   SpO2 97%   BMI 31.66 kg/m    Wt Readings from Last 3 Encounters:  04/29/22 227 lb (103 kg)  09/24/21 231 lb (104.8 kg)  07/24/20 224 lb 6.4 oz (101.8 kg)    Physical Exam Vitals and nursing note reviewed.  Constitutional:      General: He is not in acute distress.    Appearance: Normal appearance. He is obese. He is not ill-appearing, toxic-appearing or diaphoretic.  HENT:     Head: Normocephalic and atraumatic.     Nose: Nose normal.     Mouth/Throat:     Mouth: Mucous membranes are moist.     Pharynx: Oropharynx is clear.  Eyes:     General: Lids are everted, no foreign bodies appreciated.        Right eye: No foreign body, discharge or hordeolum.        Left eye: No foreign body, discharge or hordeolum.     Extraocular Movements: Extraocular movements intact.     Conjunctiva/sclera:     Right eye: Right conjunctiva is not injected. No chemosis, exudate or hemorrhage.    Left eye: Left conjunctiva is not injected. No chemosis, exudate or hemorrhage.    Pupils: Pupils are equal, round, and reactive to light.     Comments: Redness and swelling with vesicular rash around eyes and to upper cheeks  Cardiovascular:     Rate and Rhythm: Normal rate and regular rhythm.  Pulmonary:     Effort: Pulmonary effort is normal.     Breath sounds:  Normal breath sounds.  Skin:    General: Skin is warm and dry.     Capillary Refill: Capillary refill takes less than 2 seconds.     Findings: Erythema and rash present. Rash is vesicular.  Neurological:     General: No focal deficit present.     Mental Status: He is alert and oriented to person, place, and time.  Psychiatric:        Mood and Affect: Mood normal.        Behavior: Behavior normal. Behavior is cooperative.        Thought Content: Thought content normal.        Judgment: Judgment normal.  Results for orders placed or performed during the hospital encounter of 07/24/20  Surgical pathology  Result Value Ref Range   SURGICAL PATHOLOGY      SURGICAL PATHOLOGY CASE: [id number] PATIENT: [facility] Surgical Pathology Report     Clinical History: Bladder mass (crm)     FINAL MICROSCOPIC DIAGNOSIS:  A. BLADDER, TRIGONE, TURBT: Inverted urothelial papilloma  B. BLADDER, TRIGONE, DEEP, TURBT: Inverted urothelial papilloma Muscularis propria (detrusor muscle) is present and not involved   GROSS DESCRIPTION:  A: The specimen is received in formalin and consists of a 3.1 x 1.8 x 0.6 cm aggregate of tan-pink, papillary soft tissue.  The specimen is entirely submitted in 2 cassettes.  B: The specimen is received in formalin and consists of a 1.0 x 1.0 x 0.2 cm aggregate of tan-pink soft tissue fragments.  The specimen is entirely submitted in 1 cassette. Craig Staggers 07/27/2020)    Final Diagnosis performed by Casimer Lanius, MD.   Electronically signed 07/27/2020 Technical and / or Professional components performed at [facility], [place] [address] [place], [place] [address].  Immunohis Radio producer component (if applicable) was performed at [facility]. [address] [place], [place], [place] [address].   IMMUNOHISTOCHEMISTRY DISCLAIMER (if applicable): Some of these immunohistochemical stains may have been  developed and the performance characteristics determine by [facility]. Some may not have been cleared or approved by the U.S. Food and Drug Administration. The FDA has determined that such clearance or approval is not necessary. This test is used for clinical purposes. It should not be regarded as investigational or for research. This laboratory is certified under the [place] (CLIA-88) as qualified to perform high complexity clinical laboratory testing.  The controls stained appropriately.        Pertinent labs & imaging results that were available during my care of the patient were reviewed by me and considered in my medical decision making.  Assessment & Plan:  Marvin Clarke was seen today for facial swelling.  Diagnoses and all orders for this visit:  Allergic contact dermatitis due to plants, except food Irritation of both eyes Rash to face surrounding eyes and to upper cheeks. No corneal redness or abrasions noted on exam. Likely exposed to poison ivy while cutting trees. Will burst with steroids and prescribe Pataday eye drops. Red flags discussed in detail. Report any new, worsening, or persistent symptoms.  -     olopatadine (PATADAY) 0.1 % ophthalmic solution; Place 1 drop into both eyes 2 (two) times daily. -     predniSONE (DELTASONE) 20 MG tablet; Take 2 tablets (40 mg total) by mouth daily with breakfast for 5 days.    Continue all other maintenance medications.  Follow up plan: Return if symptoms worsen or fail to improve.   Continue healthy lifestyle choices, including diet (rich in fruits, vegetables, and lean proteins, and low in salt and simple carbohydrates) and exercise (at least 30 minutes of moderate physical activity daily).  Educational handout given for contact dermatitis  The above assessment and management plan was discussed with the patient. The patient verbalized understanding of and has agreed to the  management plan. Patient is aware to call the clinic if they develop any new symptoms or if symptoms persist or worsen. Patient is aware when to return to the clinic for a follow-up visit. Patient educated on when it is appropriate to go to the emergency department.   Monia Pouch, FNP-C [facility] [phone]

## 2022-11-15 ENCOUNTER — Ambulatory Visit (INDEPENDENT_AMBULATORY_CARE_PROVIDER_SITE_OTHER): Payer: Medicare Other | Admitting: Nurse Practitioner

## 2022-11-15 ENCOUNTER — Ambulatory Visit (INDEPENDENT_AMBULATORY_CARE_PROVIDER_SITE_OTHER): Payer: Medicare Other

## 2022-11-15 ENCOUNTER — Encounter: Payer: Self-pay | Admitting: Nurse Practitioner

## 2022-11-15 VITALS — BP 146/88 | HR 58 | Ht 71.0 in | Wt 224.0 lb

## 2022-11-15 DIAGNOSIS — R079 Chest pain, unspecified: Secondary | ICD-10-CM

## 2022-11-15 DIAGNOSIS — R062 Wheezing: Secondary | ICD-10-CM

## 2022-11-15 MED ORDER — BENZONATATE 100 MG PO CAPS: 100.0000 mg | ORAL_CAPSULE | Freq: Three times a day (TID) | ORAL | 0 refills | Status: DC | PRN

## 2022-11-15 MED ORDER — AZITHROMYCIN 250 MG PO TABS: ORAL_TABLET | ORAL | 0 refills | Status: AC

## 2022-11-15 MED ORDER — PREDNISONE 20 MG PO TABS: 20.0000 mg | ORAL_TABLET | Freq: Every day | ORAL | 0 refills | Status: DC

## 2022-11-15 NOTE — Progress Notes (Signed)
Acute Office Visit  Subjective:     Patient ID: Marvin Clarke, male    DOB: [birth year], 67 y.o.   MRN: [id number]  Chief Complaint  Patient presents with   Cough   Wheezing    Cough This is a new problem. The current episode started in the past 7 days. The problem has been gradually worsening. The problem occurs constantly. Associated symptoms include chest pain, nasal congestion and wheezing. Pertinent negatives include no ear congestion, fever, headaches, rash or sore throat. Nothing aggravates the symptoms.  Wheezing  This is a new problem. The current episode started yesterday. Associated symptoms include chest pain and coughing. Pertinent negatives include no fever, headaches, rash, sore throat or vomiting. Nothing aggravates the symptoms. He has tried OTC cough suppressant for the symptoms. The treatment provided no relief.     Review of Systems  Constitutional: Negative.  Negative for fever.  HENT:  Positive for congestion. Negative for sinus pain and sore throat.   Respiratory:  Positive for cough and wheezing. Negative for stridor.   Cardiovascular:  Positive for chest pain.  Gastrointestinal:  Negative for vomiting.  Skin: Negative.  Negative for itching and rash.  Neurological:  Negative for headaches.  All other systems reviewed and are negative.       Objective:    BP (!) 146/88   Pulse (!) 58   Ht '5\' 11"'$  (1.803 m)   Wt 224 lb (101.6 kg)   SpO2 96%   BMI 31.24 kg/m  BP Readings from Last 3 Encounters:  11/15/22 (!) 146/88  04/29/22 125/72  09/24/21 124/74   Wt Readings from Last 3 Encounters:  11/15/22 224 lb (101.6 kg)  04/29/22 227 lb (103 kg)  09/24/21 231 lb (104.8 kg)      Physical Exam Vitals and nursing note reviewed.  Constitutional:      Appearance: Normal appearance. He is obese.  HENT:     Head: Normocephalic and atraumatic.     Right Ear: External ear normal.     Left Ear: External ear normal.     Nose: Congestion present.   Eyes:     Conjunctiva/sclera: Conjunctivae normal.  Cardiovascular:     Rate and Rhythm: Normal rate and regular rhythm.     Pulses: Normal pulses.     Heart sounds: Normal heart sounds.  Pulmonary:     Effort: Pulmonary effort is normal.     Breath sounds: Wheezing present.  Abdominal:     General: Bowel sounds are normal.  Musculoskeletal:        General: Normal range of motion.  Skin:    General: Skin is warm.     Findings: No erythema or rash.  Neurological:     General: No focal deficit present.     Mental Status: He is alert and oriented to person, place, and time.  Psychiatric:        Mood and Affect: Mood normal.        Behavior: Behavior normal.     No results found for any visits on 11/15/22.      Assessment & Plan:  Patient presents with wheezing and upper respiratory symptoms in the past few days. Take meds as prescribed - Use a cool mist humidifier  -Use saline nose sprays frequently -Force fluids -For fever or aches or pains- take Tylenol or ibuprofen. -completed chest x-ray results pending -If symptoms do not improve, she may need to be COVID tested to rule this out Follow up  with worsening unresolved symptoms  Problem List Items Addressed This Visit   None Visit Diagnoses     Chest pain in adult    -  Primary   Relevant Medications   benzonatate (TESSALON PERLES) 100 MG capsule   azithromycin (ZITHROMAX) 250 MG tablet   Other Relevant Orders   DG Chest 2 View   Wheezing       Relevant Medications   predniSONE (DELTASONE) 20 MG tablet       Meds ordered this encounter  Medications   predniSONE (DELTASONE) 20 MG tablet    Sig: Take 1 tablet (20 mg total) by mouth daily with breakfast.    Dispense:  6 tablet    Refill:  0    Order Specific Question:   Supervising Provider    Answer:   Jeneen Rinks   benzonatate (TESSALON PERLES) 100 MG capsule    Sig: Take 1 capsule (100 mg total) by mouth 3 (three) times daily as needed.     Dispense:  20 capsule    Refill:  0    Order Specific Question:   Supervising Provider    Answer:   Jeneen Rinks   azithromycin (ZITHROMAX) 250 MG tablet    Sig: Take 2 tablets on day 1, then 1 tablet daily on days 2 through 5    Dispense:  6 tablet    Refill:  0    Order Specific Question:   Supervising Provider    Answer:   Claretta Fraise [[id number]]    Return if symptoms worsen or fail to improve.  Marvin Lynn, NP

## 2022-11-15 NOTE — Patient Instructions (Signed)

## 2022-11-22 ENCOUNTER — Encounter: Payer: Self-pay | Admitting: Nurse Practitioner

## 2022-11-22 ENCOUNTER — Ambulatory Visit (INDEPENDENT_AMBULATORY_CARE_PROVIDER_SITE_OTHER): Payer: Medicare Other | Admitting: Nurse Practitioner

## 2022-11-22 VITALS — BP 144/85 | HR 57 | Temp 97.6°F | Resp 20 | Ht 71.0 in | Wt 226.0 lb

## 2022-11-22 DIAGNOSIS — R051 Acute cough: Secondary | ICD-10-CM

## 2022-11-22 MED ORDER — BENZONATATE 100 MG PO CAPS: 100.0000 mg | ORAL_CAPSULE | Freq: Three times a day (TID) | ORAL | 0 refills | Status: DC | PRN

## 2022-11-22 MED ORDER — AZITHROMYCIN 250 MG PO TABS: ORAL_TABLET | ORAL | 0 refills | Status: DC

## 2022-11-22 NOTE — Patient Instructions (Signed)

## 2022-11-22 NOTE — Progress Notes (Signed)
Subjective:    Patient ID: Marvin Clarke, male    DOB: 1956/06/27, 67 y.o.   MRN: [id number]  Chief Complaint: Cough (Saw Marvin Clarke last week and was given prednisone and cough med. No better)   Had cough last week and was seen by Marvin Salt, NP. Was given prednisone and cough meds no better.  Cough This is Clarke new problem. The current episode started 1 to 4 weeks ago. The problem has been waxing and waning. The problem occurs every few hours. The cough is Productive of sputum. Associated symptoms include nasal congestion. Pertinent negatives include no chills, ear congestion, ear pain, fever, postnasal drip, rhinorrhea or shortness of breath. Exacerbated by: when lays on left side- is worse. He has tried OTC cough suppressant for the symptoms. The treatment provided mild relief.       Review of Systems  Constitutional:  Negative for chills and fever.  HENT:  Negative for ear pain, postnasal drip and rhinorrhea.   Respiratory:  Positive for cough. Negative for shortness of breath.        Objective:   Physical Exam Vitals reviewed.  Constitutional:      Appearance: Normal appearance.  Cardiovascular:     Rate and Rhythm: Normal rate and regular rhythm.     Heart sounds: Normal heart sounds.  Pulmonary:     Effort: Pulmonary effort is normal.     Breath sounds: Normal breath sounds.  Skin:    General: Skin is warm.  Neurological:     General: No focal deficit present.     Mental Status: He is alert and oriented to person, place, and time.  Psychiatric:        Mood and Affect: Mood normal.        Behavior: Behavior normal.    BP (!) 144/85   Pulse (!) 57   Temp 97.6 F (36.4 C) (Temporal)   Resp 20   Ht '5\' 11"'$  (1.803 m)   Wt 226 lb (102.5 kg)   SpO2 97%   BMI 31.52 kg/m         Assessment & Plan:  Marvin Clarke in today with chief complaint of Cough (Saw Marvin Clarke last week and was given prednisone and cough med. No better)   1. Acute cough 1. Take meds as prescribed 2. Use Clarke  cool mist humidifier especially during the winter months and when heat has been humid. 3. Use saline nose sprays frequently 4. Saline irrigations of the nose can be very helpful if Clarke frequently.  * 4X daily for 1 week*  * Use of Clarke nettie pot can be helpful with this. Follow directions with this* 5. Drink plenty of fluids 6. Keep thermostat turn down low 7.For any cough or congestion- tessalon perles 8. For fever or aces or pains- take tylenol or ibuprofen appropriate for age and weight.  * for fevers greater than 101 orally you may alternate ibuprofen and tylenol every  3 hours.   Meds ordered this encounter  Medications   azithromycin (ZITHROMAX Z-PAK) 250 MG tablet    Sig: As directed    Dispense:  6 tablet    Refill:  0    Order Specific Question:   Supervising Provider    Answer:   Marvin Clarke [[id number]]   benzonatate (TESSALON PERLES) 100 MG capsule    Sig: Take 1 capsule (100 mg total) by mouth 3 (three) times daily as needed for cough.    Dispense:  20 capsule  Refill:  0    Order Specific Question:   Supervising Provider    Answer:   Marvin Clarke [id number]       The above assessment and management plan was discussed with the patient. The patient verbalized understanding of and has agreed to the management plan. Patient is aware to call the clinic if symptoms persist or worsen. Patient is aware when to return to the clinic for Clarke follow-up visit. Patient educated on when it is appropriate to go to the emergency department.   Marvin Hassell Done, FNP

## 2023-02-09 ENCOUNTER — Encounter: Payer: Self-pay | Admitting: Family Medicine

## 2023-02-09 ENCOUNTER — Other Ambulatory Visit: Payer: Self-pay

## 2023-02-09 ENCOUNTER — Encounter (HOSPITAL_COMMUNITY): Payer: Self-pay

## 2023-02-09 ENCOUNTER — Emergency Department (HOSPITAL_COMMUNITY)
Admission: EM | Admit: 2023-02-09 | Discharge: 2023-02-09 | Disposition: A | Discharge status: Home / Self Care | Payer: Medicare Other | Attending: Emergency Medicine | Admitting: Emergency Medicine

## 2023-02-09 ENCOUNTER — Ambulatory Visit (INDEPENDENT_AMBULATORY_CARE_PROVIDER_SITE_OTHER): Payer: Medicare Other | Admitting: Family Medicine

## 2023-02-09 VITALS — BP 159/82 | HR 74 | Temp 97.8°F | Resp 20 | Ht 71.0 in | Wt 226.0 lb

## 2023-02-09 DIAGNOSIS — T2661XA Corrosion of cornea and conjunctival sac, right eye, initial encounter: Secondary | ICD-10-CM

## 2023-02-09 DIAGNOSIS — T543X1A Toxic effect of corrosive alkalis and alkali-like substances, accidental (unintentional), initial encounter: Secondary | ICD-10-CM

## 2023-02-09 DIAGNOSIS — X58XXXA Exposure to other specified factors, initial encounter: Secondary | ICD-10-CM | POA: Diagnosis not present

## 2023-02-09 DIAGNOSIS — T2691XA Corrosion of right eye and adnexa, part unspecified, initial encounter: Secondary | ICD-10-CM

## 2023-02-09 DIAGNOSIS — S0591XA Unspecified injury of right eye and orbit, initial encounter: Secondary | ICD-10-CM | POA: Diagnosis not present

## 2023-02-09 MED ORDER — POLYMYXIN B-TRIMETHOPRIM 10000-0.1 UNIT/ML-% OP SOLN: 1.0000 [drp] | OPHTHALMIC | 0 refills | Status: DC

## 2023-02-09 MED ORDER — TETRACAINE HCL 0.5 % OP SOLN
2.0000 [drp] | Freq: Once | OPHTHALMIC | Status: AC
  Administered 2023-02-09: 2 [drp] via OPHTHALMIC

## 2023-02-09 MED ORDER — FLUORESCEIN SODIUM 1 MG OP STRP
1.0000 | ORAL_STRIP | Freq: Once | OPHTHALMIC | Status: AC
  Administered 2023-02-09: 1 via OPHTHALMIC

## 2023-02-09 MED ORDER — MORPHINE SULFATE 15 MG PO TABS: 7.5000 mg | ORAL_TABLET | ORAL | 0 refills | Status: DC | PRN

## 2023-02-09 NOTE — Discharge Instructions (Signed)
Take tylenol 1000mg (2 extra strength) four times a day.   Take 4 over the counter ibuprofen tablets 3 times a day or 2 over-the-counter naproxen tablets twice a day for pain. Also take tylenol 1000mg (2 extra strength) four times a day.    Use the drops as prescribed.  Follow up with optho.

## 2023-02-09 NOTE — ED Notes (Signed)
Pt was taken to the eye wash station. His eye was irrigated for 15 minutes.

## 2023-02-09 NOTE — ED Notes (Signed)
Pt was taking to the eye wash station. His eye was irrigated for 15 minutes.

## 2023-02-09 NOTE — ED Notes (Signed)
I flushed pt's right eye at eye station for 15 mins

## 2023-02-09 NOTE — ED Notes (Signed)
Pt was taken to eye wash station. Pt eye's washed for 15 minutes.

## 2023-02-09 NOTE — Progress Notes (Signed)
Subjective:  Patient ID: Marvin Clarke, male    DOB: [birth year], 67 y.o.   MRN: [id number]  Patient Care Team: Chevis Pretty, FNP as PCP - General (Nurse Practitioner)   Chief Complaint:  Eye Burn   HPI: Marvin Clarke is a 67 y.o. male presenting on 02/09/2023 for Eye Burn   Pt states he was working on a clogged sink. He poured drano into the sink. This did not work so he got under the sink to work on it. When he disconnected the pipe the drano went into his right eye and on his face. Reports immediate burning and blurring of vision. Came straight to the office after incident.     Relevant past medical, surgical, family, and social history reviewed and updated as indicated.  Allergies and medications reviewed and updated. Data reviewed: Chart in Epic.   Past Medical History:  Diagnosis Date   Arthritis    psoriatic arthritis   Bladder mass    Borderline diabetes     Past Surgical History:  Procedure Laterality Date   [facility]   HERNIA REPAIR  2000   inguinal hernia   REPLACEMENT TOTAL KNEE BILATERAL  2014 and 2015   ROTATOR CUFF REPAIR Right 05/11/2020   SPINAL FUSION  2017   C 1 to C5 and T 1   TRANSURETHRAL RESECTION OF BLADDER TUMOR N/A 07/24/2020   Procedure: TRANSURETHRAL RESECTION OF BLADDER TUMOR (TURBT)/ CYSTOSCOPY/ INSTILLATION OF GEMCITABINE;  Surgeon: Janith Lima, MD;  Location: [facility];  Service: Urology;  Laterality: N/A;  ONLY NEEDS 30 MIN FOR PROCEDURE    Social History   Socioeconomic History   Marital status: Married    Spouse name: Not on file   Number of children: Not on file   Years of education: Not on file   Highest education level: Not on file  Occupational History   Not on file  Tobacco Use   Smoking status: Never   Smokeless tobacco: Former    Types: Chew    Quit date: 11/14/1978  Vaping Use   Vaping Use: Never used  Substance and Sexual Activity   Alcohol use: No    Drug use: No   Sexual activity: Not on file  Other Topics Concern   Not on file  Social History Narrative   Not on file   Social Determinants of Health   Financial Resource Strain: Not on file  Food Insecurity: Not on file  Transportation Needs: Not on file  Physical Activity: Not on file  Stress: Not on file  Social Connections: Not on file  Intimate Partner Violence: Not on file    Outpatient Encounter Medications as of 02/09/2023  Medication Sig   acetaminophen (TYLENOL) 500 MG tablet Take 500 mg by mouth every 6 (six) hours as needed.   aluminum hydroxide-magnesium carbonate (GAVISCON) 95-358 MG/15ML SUSP Take by mouth.   azithromycin (ZITHROMAX Z-PAK) 250 MG tablet As directed   benzonatate (TESSALON PERLES) 100 MG capsule Take 1 capsule (100 mg total) by mouth 3 (three) times daily as needed for cough.   gabapentin (NEURONTIN) 300 MG capsule Take by mouth.   olopatadine (PATADAY) 0.1 % ophthalmic solution Place 1 drop into both eyes 2 (two) times daily.   Risankizumab-rzaa (SKYRIZI PEN [place]) Inject into the skin.   No facility-administered encounter medications on file as of 02/09/2023.    No Known Allergies  Review of Systems  Unable to perform ROS: Other (emergent situation with transfer to ED)        Objective:  BP (!) 159/82   Pulse 74   Temp 97.8 F (36.6 C) (Oral)   Resp 20   Ht 5\' 11"  (1.803 m)   Wt 226 lb (102.5 kg)   SpO2 97%   BMI 31.52 kg/m    Wt Readings from Last 3 Encounters:  02/09/23 226 lb (102.5 kg)  11/22/22 226 lb (102.5 kg)  11/15/22 224 lb (101.6 kg)    Physical Exam Vitals and nursing note reviewed.  Constitutional:      General: He is in acute distress.     Appearance: He is obese.  HENT:     Head: Normocephalic.     Nose: Nose normal.     Mouth/Throat:     Lips: Pink.     Mouth: Mucous membranes are moist.     Pharynx: Oropharynx is clear.  Eyes:     General:        Right eye: Discharge present.     Conjunctiva/sclera:      Right eye: Right conjunctiva is injected. Exudate present.     Comments: Right orbital erythema, hazy appearance of right cornea  Cardiovascular:     Rate and Rhythm: Normal rate.  Pulmonary:     Effort: Pulmonary effort is normal.  Skin:    General: Skin is warm.     Capillary Refill: Capillary refill takes less than 2 seconds.  Neurological:     General: No focal deficit present.     Mental Status: He is alert.     Results for orders placed or performed during the hospital encounter of 07/24/20  Surgical pathology  Result Value Ref Range   SURGICAL PATHOLOGY      SURGICAL PATHOLOGY CASE: [id number] PATIENT: [facility] Surgical Pathology Report     Clinical History: Bladder mass (crm)     FINAL MICROSCOPIC DIAGNOSIS:  A. BLADDER, TRIGONE, TURBT: Inverted urothelial papilloma  B. BLADDER, TRIGONE, DEEP, TURBT: Inverted urothelial papilloma Muscularis propria (detrusor muscle) is present and not involved   GROSS DESCRIPTION:  A: The specimen is received in formalin and consists of a 3.1 x 1.8 x 0.6 cm aggregate of tan-pink, papillary soft tissue.  The specimen is entirely submitted in 2 cassettes.  B: The specimen is received in formalin and consists of a 1.0 x 1.0 x 0.2 cm aggregate of tan-pink soft tissue fragments.  The specimen is entirely submitted in 1 cassette. Craig Staggers 07/27/2020)    Final Diagnosis performed by Casimer Lanius, MD.   Electronically signed 07/27/2020 Technical and / or Professional components performed at [facility], [place] [address] [place], [place] [address].  Immunohis Radio producer component (if applicable) was performed at [facility]. [address] [place], [place], [place] [address].   IMMUNOHISTOCHEMISTRY DISCLAIMER (if applicable): Some of these immunohistochemical stains may have been developed and the performance characteristics determine by [facility]  Some may not have been cleared or approved by the U.S. Food and Drug Administration. The FDA has determined that such clearance or approval is not necessary. This test is used for clinical purposes. It should not be regarded as investigational or for research. This laboratory is certified under the [place] (CLIA-88) as qualified to perform high complexity clinical laboratory testing.  The controls stained appropriately.        Pertinent labs & imaging results that were available  during my care of the patient were reviewed by me and considered in my medical decision making.  Assessment & Plan:  Carver was seen today for eye burn.  Diagnoses and all orders for this visit:  Alkaline chemical burn of right cornea, initial encounter Eyes flushed for 10 minutes with room temperature tap water. EMS took pt emergently to ED    Continue all other maintenance medications.  Follow up plan: ED for treatment    The above assessment and management plan was discussed with the patient. The patient verbalized understanding of and has agreed to the management plan. Patient is aware to call the clinic if they develop any new symptoms or if symptoms persist or worsen. Patient is aware when to return to the clinic for a follow-up visit. Patient educated on when it is appropriate to go to the emergency department.   Monia Pouch, FNP-C [facility] [phone]

## 2023-02-09 NOTE — ED Provider Notes (Signed)
[facility] Provider Note   CSN: [id number] Arrival date & time: 02/09/23  1723     History  Chief Complaint  Patient presents with   eye exposed to drain cleaner    Marvin Clarke is a 67 y.o. male.  67 yo M with a chief complaint of Drano getting into his eye.  He was trying to clear a clog in the sink and when he was unable to eat open the pipe down below in the Drano dumped into his eye on the right side of his face.  He went to his family practice office where they rinsed his eye out and then called EMS who also rinsed his eye on.  He is complaining of pain and some trouble with vision on that side.        Home Medications Prior to Admission medications   Medication Sig Start Date End Date Taking? Authorizing Provider  morphine (MSIR) 15 MG tablet Take 0.5 tablets (7.5 mg total) by mouth every 4 (four) hours as needed for severe pain. 02/09/23  Yes Deno Etienne, DO  trimethoprim-polymyxin b (POLYTRIM) ophthalmic solution Place 1 drop into the right eye every 4 (four) hours. 02/09/23  Yes Deno Etienne, DO  acetaminophen (TYLENOL) 500 MG tablet Take 500 mg by mouth every 6 (six) hours as needed.    [provider]  aluminum hydroxide-magnesium carbonate (GAVISCON) 95-358 MG/15ML SUSP Take by mouth.    [provider]  azithromycin (ZITHROMAX Z-PAK) 250 MG tablet As directed 11/22/22   Hassell Done, Mary-Margaret, FNP  benzonatate (TESSALON PERLES) 100 MG capsule Take 1 capsule (100 mg total) by mouth 3 (three) times daily as needed for cough. 11/22/22   Hassell Done Mary-Margaret, FNP  gabapentin (NEURONTIN) 300 MG capsule Take by mouth. 03/09/22   [provider]  olopatadine (PATADAY) 0.1 % ophthalmic solution Place 1 drop into both eyes 2 (two) times daily. 04/29/22   Rakes, Connye Burkitt, FNP  Risankizumab-rzaa (SKYRIZI PEN [place]) Inject into the skin.    [provider]      Allergies    Patient has no known allergies.     Review of Systems   Review of Systems  Physical Exam Updated Vital Signs BP (!) 174/80   Pulse 70   Temp 98.2 F (36.8 C)   Resp 18   Ht 5\' 11"  (1.803 m)   Wt 102.5 kg   SpO2 99%   BMI 31.52 kg/m  Physical Exam Vitals and nursing note reviewed.  Constitutional:      Appearance: He is well-developed.  HENT:     Head: Normocephalic and atraumatic.  Eyes:     Pupils: Pupils are equal, round, and reactive to light.     Comments: Injected eye with some discharge on the right.  Some periorbital erythema.  No obvious corneal abrasion on fluorescein exam.  Neck:     Vascular: No JVD.  Cardiovascular:     Rate and Rhythm: Normal rate and regular rhythm.     Heart sounds: No murmur heard.    No friction rub. No gallop.  Pulmonary:     Effort: No respiratory distress.     Breath sounds: No wheezing.  Abdominal:     General: There is no distension.     Tenderness: There is no abdominal tenderness. There is no guarding or rebound.  Musculoskeletal:        General: Normal range of motion.     Cervical back: Normal  range of motion and neck supple.  Skin:    Coloration: Skin is not pale.     Findings: No rash.  Neurological:     Mental Status: He is alert and oriented to person, place, and time.  Psychiatric:        Behavior: Behavior normal.     ED Results / Procedures / Treatments   Labs (all labs ordered are listed, but only abnormal results are displayed) Labs Reviewed - No data to display  EKG None  Radiology No results found.  Procedures Procedures    Medications Ordered in ED Medications  tetracaine (PONTOCAINE) 0.5 % ophthalmic solution 2 drop (has no administration in time range)  fluorescein ophthalmic strip 1 strip (has no administration in time range)    ED Course/ Medical Decision Making/ A&P                             Medical Decision Making Risk Prescription drug management.   67 yo M with a chief complaints of right eye pain.   Patient unfortunately suffered a chemical injury with drain cleaner.  He has been irrigated for about 30 minutes in total.  Initial pH 9.  Will have him continue to irrigate.  Patient with about a hour irrigation and total here.  Patient is now down to 7.  Given ophthalmology follow-up.  Antibiotic eyedrops.  Bacitracin twice daily for chemical burn to the face.  7:34 PM:  I have discussed the diagnosis/risks/treatment options with the patient.  Evaluation and diagnostic testing in the emergency department does not suggest an emergent condition requiring admission or immediate intervention beyond what has been performed at this time.  They will follow up with Ophto. We also discussed returning to the ED immediately if new or worsening sx occur. We discussed the sx which are most concerning (e.g., sudden worsening pain, fever, inability to tolerate by mouth) that necessitate immediate return. Medications administered to the patient during their visit and any new prescriptions provided to the patient are listed below.  Medications given during this visit Medications  tetracaine (PONTOCAINE) 0.5 % ophthalmic solution 2 drop (has no administration in time range)  fluorescein ophthalmic strip 1 strip (has no administration in time range)     The patient appears reasonably screen and/or stabilized for discharge and I doubt any other medical condition or other [facility] requiring further screening, evaluation, or treatment in the ED at this time prior to discharge.          Final Clinical Impression(s) / ED Diagnoses Final diagnoses:  Chemical injury of right eye    Rx / DC Orders ED Discharge Orders          Ordered    trimethoprim-polymyxin b (POLYTRIM) ophthalmic solution  Every 4 hours        02/09/23 1932    morphine (MSIR) 15 MG tablet  Every 4 hours PRN        02/09/23 1932              Deno Etienne, DO 02/09/23 1934

## 2023-02-09 NOTE — ED Triage Notes (Signed)
Pt arrived via [facility] from dr office. Pt splashed drain cleaner on face by accident and it got in his right eye. Pt states he flushed eye for 10 mins at home. The dr office flushed his eye for 10 mins and EMS flushed his eye the entire way here. Pt's right cheek and eye are erythematous. Pt states has blurred vision in right eye.

## 2023-02-13 ENCOUNTER — Telehealth: Payer: Self-pay

## 2023-02-13 NOTE — Telephone Encounter (Signed)
     Patient  visit on 3/28  at [facility]   Have you been able to follow up with your primary care physician? Yes   The patient was or was not able to obtain any needed medicine or equipment. Yes   Are there diet recommendations that you are having difficulty following? Na   Patient expresses understanding of discharge instructions and education provided has no other needs at this time.  Yes      [place] [phone] [address], [place], [place] [address] Phone: [phone] Email: [email or web address]

## 2023-10-03 ENCOUNTER — Other Ambulatory Visit: Payer: Self-pay

## 2023-10-03 ENCOUNTER — Ambulatory Visit: Payer: Medicare Other | Attending: Orthopedic Surgery | Admitting: Physical Therapy

## 2023-10-03 DIAGNOSIS — M62838 Other muscle spasm: Secondary | ICD-10-CM | POA: Insufficient documentation

## 2023-10-03 DIAGNOSIS — M5459 Other low back pain: Secondary | ICD-10-CM | POA: Diagnosis present

## 2023-10-03 NOTE — Therapy (Signed)
OUTPATIENT PHYSICAL THERAPY THORACOLUMBAR EVALUATION   Patient Name: Marvin Clarke MRN: [id number] DOB:[birth year], 67 y.o., male Today's Date: 10/03/2023  END OF SESSION:  PT End of Session - 10/03/23 1415     Visit Number 1    Number of Visits 12    Date for PT Re-Evaluation 01/01/24    Authorization Type FOTO.    PT Start Time 0145    PT Stop Time 0235    PT Time Calculation (min) 50 min    Activity Tolerance Patient tolerated treatment well    Behavior During Therapy [facility] for tasks assessed/performed             Past Medical History:  Diagnosis Date   Arthritis    psoriatic arthritis   Bladder mass    Borderline diabetes    Past Surgical History:  Procedure Laterality Date   [facility]ICAL [facility]   HERNIA REPAIR  2000   inguinal hernia   REPLACEMENT TOTAL KNEE BILATERAL  2014 and 2015   ROTATOR CUFF REPAIR Right 05/11/2020   SPINAL FUSION  2017   C 1 to C5 and T 1   TRANSURETHRAL RESECTION OF BLADDER TUMOR N/A 07/24/2020   Procedure: TRANSURETHRAL RESECTION OF BLADDER TUMOR (TURBT)/ CYSTOSCOPY/ INSTILLATION OF GEMCITABINE;  Surgeon: Jannifer Hick, MD;  Location: [facility];  Service: Urology;  Laterality: N/A;  ONLY NEEDS 30 MIN FOR PROCEDURE   Patient Active Problem List   Diagnosis Date Noted   Psoriasis 01/02/2014    REFERRING PROVIDER: Doyce Loose NP.  REFERRING DIAG: Low back pain with right-sided sciatica.  Rationale for Evaluation and Treatment: Rehabilitation  THERAPY DIAG:  Other low back pain - Plan: PT plan of care cert/re-cert  Other muscle spasm - Plan: PT plan of care cert/re-cert  ONSET DATE: ~2 months  SUBJECTIVE:                                                                                                                                                                                           SUBJECTIVE STATEMENT: The patient presents to the clinic with c/o low back pain with radiation into  his right LE (and sometimes on left) especially when he first gets out of bed in the morning.  The pain is electric in nature.  Standing even short periods of time increase his pain to very high levels.  Lying in his recliner can help decrease his pain some.    PERTINENT HISTORY:  See above.  PAIN:  Are you having pain? Yes: NPRS scale: 7-8/10 Pain location: Right low back, buttock and  down length of right LE. Pain description: Electric, throbbing, sharp and numb. Aggravating factors: As above. Relieving factors: as above.    PRECAUTIONS: None  RED FLAGS: None   WEIGHT BEARING RESTRICTIONS: No  FALLS:  Has patient fallen in last 6 months? Yes. Number of falls Stepped in a hole and fell which he wonders if this contributed to his back pain.  Also, goff tractor and twisted and fell.    LIVING ENVIRONMENT: Lives with: lives with their spouse Lives in: House/apartment Has following equipment at home: None  OCCUPATION: Retired.  PLOF: Independent  PATIENT GOALS: Get back to where he was before onset of pain.   OBJECTIVE:  Note: Objective measures were completed at Evaluation unless otherwise noted.  DIAGNOSTIC FINDINGS: X-ray:  09/26/23:  1.No acute fracture. 2. Grade 1 anterolisthesis at L4-L5. Trace anterolisthesis at L2-L3. 3. Mild dextrocurvature. 4. Multilevel degenerative disc changes, moderate at L3-L4, and mild in the remaining levels. 5. Moderate multilevel facet hypertrophy. 6. Mild degenerative changes of the sacroiliac joints.    POSTURE: rounded shoulders, forward head, decreased lumbar lordosis, and flexed trunk   PALPATION: Very tender to palpation over his right SIJ and gluteal musculature.  LUMBAR ROM:  Active trunk flexion decreased by 50% and extension is 10 degrees.  LOWER EXTREMITY MMT:   Normal LE strength.  LUMBAR SPECIAL TESTS:  (+) right SLR and FABER test.  Right LE significantly shorter than left (possibly related to a posterior  rotation of the pelvis on the right).  OBSERVATION:  Redness in left low back due to psoriasis    GAIT: The patient is walking in a flexed trunk posture in obvious pain.  TODAY'S TREATMENT:                                                                                                                              DATE: HMP and IFC at 80-150 Hz on 40% scan x 20 minutes to patient's right low back and gluteal region.   Normal modality response following removal of modality.   PATIENT EDUCATION:  Education details:  Person educated:  International aid/development worker:  Education comprehension:   HOME EXERCISE PROGRAM:   ASSESSMENT:  CLINICAL IMPRESSION: The patient presents to OPPT with c/o low back and right LE pain that has been ongoing for approximately 2 months.  He experiences very high pain-levels when he is walking.  He is very tender to palpation over his right SIJ and gluteal musculature.  He demonstrates a positive right SLR and FABER test.  His FOTO limitation score is a 35.43.  His gait is very antalgic in nature.  Patient may benefit from skilled physical therapy intervention to address pain and deficits.  OBJECTIVE IMPAIRMENTS: Abnormal gait, decreased activity tolerance, difficulty walking, decreased ROM, increased muscle spasms, postural dysfunction, and pain.   ACTIVITY LIMITATIONS: carrying, lifting, bending, standing, and locomotion level  PARTICIPATION LIMITATIONS: meal prep, cleaning, laundry, shopping, community activity, and yard work  REHAB POTENTIAL: Good  CLINICAL DECISION MAKING: Evolving/moderate complexity  EVALUATION COMPLEXITY: Low   GOALS:  SHORT TERM GOALS: Target date: 10/17/23.  Ind with an independent HEP. Goal status: INITIAL   LONG TERM GOALS: Target date: 12/31/22  Ind with an advanced HEP.  Goal status: INITIAL  2.  Eliminate right LE pain/symptoms.  Goal status: INITIAL  3.  Walk a community distance with pain not > 3/10.  Goal status:  INITIAL  4.  Perform ADL's with pain not > 3/10.  Goal status: INITIAL  PLAN:  PT FREQUENCY: 2x/week  PT DURATION: 6 weeks  PLANNED INTERVENTIONS: 97110-Therapeutic exercises, 97530- Therapeutic activity, [id number]- Neuromuscular re-education, 97535- Self Care, 27253- Manual therapy, 97014- Electrical stimulation (unattended), 97035- Ultrasound, 66440- Traction (mechanical), Patient/Family education, Dry Needling, Cryotherapy, and Moist heat.  PLAN FOR NEXT SESSION: Combo e'stim/US, STW/M, Core exercise progression, spinal protection techniques and body mechanics training.  Reverse muscle energy technique on right to help equalize leg lengths, standing hip extension.  Consider intermittent lumbar traction beginning at 35% body weight.   Lyfe Monger, [place], PT 10/03/2023, 3:04 PM

## 2023-10-05 ENCOUNTER — Encounter: Payer: Self-pay | Admitting: *Deleted

## 2023-10-05 ENCOUNTER — Ambulatory Visit: Payer: Medicare Other | Admitting: *Deleted

## 2023-10-05 DIAGNOSIS — M5459 Other low back pain: Secondary | ICD-10-CM | POA: Diagnosis not present

## 2023-10-05 DIAGNOSIS — M62838 Other muscle spasm: Secondary | ICD-10-CM

## 2023-10-05 NOTE — Therapy (Signed)
+ OUTPATIENT PHYSICAL THERAPY THORACOLUMBAR TREATMENT   Patient Name: Marvin Clarke MRN: [id number] DOB:11/13/1956, 67 y.o., male Today's Date: 10/05/2023  END OF SESSION:  PT End of Session - 10/05/23 1336     Visit Number 2    Number of Visits 12    Date for PT Re-Evaluation 01/01/24    Authorization Type FOTO.    PT Start Time 1345    PT Stop Time 1436    PT Time Calculation (min) 51 min             Past Medical History:  Diagnosis Date   Arthritis    psoriatic arthritis   Bladder mass    Borderline diabetes    Past Surgical History:  Procedure Laterality Date   [facility]  [facility]   HERNIA REPAIR  2000   inguinal hernia   REPLACEMENT TOTAL KNEE BILATERAL  2014 and 2015   ROTATOR CUFF REPAIR Right 05/11/2020   SPINAL FUSION  2017   C 1 to C5 and T 1   TRANSURETHRAL RESECTION OF BLADDER TUMOR N/A 07/24/2020   Procedure: TRANSURETHRAL RESECTION OF BLADDER TUMOR (TURBT)/ CYSTOSCOPY/ INSTILLATION OF GEMCITABINE;  Surgeon: Jannifer Hick, MD;  Location: [facility];  Service: Urology;  Laterality: N/A;  ONLY NEEDS 30 MIN FOR PROCEDURE   Patient Active Problem List   Diagnosis Date Noted   Psoriasis 01/02/2014    REFERRING PROVIDER: Doyce Loose NP.  REFERRING DIAG: Low back pain with right-sided sciatica.  Rationale for Evaluation and Treatment: Rehabilitation  THERAPY DIAG:  Other low back pain  Other muscle spasm  ONSET DATE: ~2 months  SUBJECTIVE:                                                                                                                                                                                           SUBJECTIVE STATEMENT: MD ordered MRI. Yesterday was a really bad day. Increased Gaba meds  PERTINENT HISTORY:  See above.  PAIN:  Are you having pain? Yes: NPRS scale: 5/10 Pain location: Right low back, buttock and down length of right LE. Pain description: Electric, throbbing,  sharp and numb. Aggravating factors: As above. Relieving factors: as above.    PRECAUTIONS: None  RED FLAGS: None   WEIGHT BEARING RESTRICTIONS: No  FALLS:  Has patient fallen in last 6 months? Yes. Number of falls Stepped in a hole and fell which he wonders if this contributed to his back pain.  Also, goff tractor and twisted and fell.    LIVING ENVIRONMENT: Lives with: lives with their spouse Lives in:  House/apartment Has following equipment at home: None  OCCUPATION: Retired.  PLOF: Independent  PATIENT GOALS: Get back to where he was before onset of pain.   OBJECTIVE:  Note: Objective measures were completed at Evaluation unless otherwise noted.  DIAGNOSTIC FINDINGS: X-ray:  09/26/23:  1.No acute fracture. 2. Grade 1 anterolisthesis at L4-L5. Trace anterolisthesis at L2-L3. 3. Mild dextrocurvature. 4. Multilevel degenerative disc changes, moderate at L3-L4, and mild in the remaining levels. 5. Moderate multilevel facet hypertrophy. 6. Mild degenerative changes of the sacroiliac joints.    POSTURE: rounded shoulders, forward head, decreased lumbar lordosis, and flexed trunk   PALPATION: Very tender to palpation over his right SIJ and gluteal musculature.  LUMBAR ROM:  Active trunk flexion decreased by 50% and extension is 10 degrees.  LOWER EXTREMITY MMT:   Normal LE strength.  LUMBAR SPECIAL TESTS:  (+) right SLR and FABER test.  Right LE significantly shorter than left (possibly related to a posterior rotation of the pelvis on the right).  OBSERVATION:  Redness in left low back due to psoriasis    GAIT: The patient is walking in a flexed trunk posture in obvious pain.  TODAY'S TREATMENT:                                                                                                                              DATE:10-05-23 Discussed movement patterns to decrease pain triggers with transitional movements  and ADL's [place] combo x 10 mins 1.5 w/cm2 to LB  paras in LT side lying Manual STW to Bil. LB paras in LT side lying   HMP and IFC at 80-150 Hz on 40% scan x 15  minutes to patient's right low back and gluteal region.   Normal modality response following removal of modality.   PATIENT EDUCATION:  Education details:  Person educated:  International aid/development worker:  Education comprehension:   HOME EXERCISE PROGRAM:   ASSESSMENT:  CLINICAL IMPRESSION: Pt arrived today doing fair with LBP 5/10. 8/10 yesterday. Rx focused on discussion and review of ADL's and movement modifications to decrease pain triggers. [place] combo as well as STW were  performed to decrease pain and mm tension in LB paras and Bil. SIJ's. Decreased pain end of session.   OBJECTIVE IMPAIRMENTS: Abnormal gait, decreased activity tolerance, difficulty walking, decreased ROM, increased muscle spasms, postural dysfunction, and pain.   ACTIVITY LIMITATIONS: carrying, lifting, bending, standing, and locomotion level  PARTICIPATION LIMITATIONS: meal prep, cleaning, laundry, shopping, community activity, and yard work  [facility] POTENTIAL: Good  CLINICAL DECISION MAKING: Evolving/moderate complexity  EVALUATION COMPLEXITY: Low   GOALS:  SHORT TERM GOALS: Target date: 10/17/23.  Ind with an independent HEP. Goal status: INITIAL   LONG TERM GOALS: Target date: 12/31/22  Ind with an advanced HEP.  Goal status: INITIAL  2.  Eliminate right LE pain/symptoms.  Goal status: INITIAL  3.  Walk a community distance with pain not > 3/10.  Goal status: INITIAL  4.  Perform ADL's with pain not > 3/10.  Goal status: INITIAL  PLAN:  PT FREQUENCY: 2x/week  PT DURATION: 6 weeks  PLANNED INTERVENTIONS: 97110-Therapeutic exercises, 97530- Therapeutic activity, [id number]- Neuromuscular re-education, 97535- Self Care, 30865- Manual therapy, 97014- Electrical stimulation (unattended), 97035- Ultrasound, 78469- Traction (mechanical), Patient/Family education, Dry Needling, Cryotherapy, and  Moist heat.  PLAN FOR NEXT SESSION: Combo e'stim/US, STW/M, Core exercise progression, spinal protection techniques and body mechanics training.  Reverse muscle energy technique on right to help equalize leg lengths, standing hip extension.  Consider intermittent lumbar traction beginning at 35% body weight.   Jet Traynham,CHRIS, PTA 10/05/2023, 5:06 PM

## 2023-10-09 ENCOUNTER — Ambulatory Visit: Payer: Medicare Other | Admitting: *Deleted

## 2023-10-09 ENCOUNTER — Encounter: Payer: Self-pay | Admitting: *Deleted

## 2023-10-09 DIAGNOSIS — M62838 Other muscle spasm: Secondary | ICD-10-CM

## 2023-10-09 DIAGNOSIS — M5459 Other low back pain: Secondary | ICD-10-CM

## 2023-10-09 NOTE — Therapy (Signed)
+ OUTPATIENT PHYSICAL THERAPY THORACOLUMBAR TREATMENT   Patient Name: Marvin Clarke MRN: [id number] DOB:1956-11-01, 67 y.o., male Today's Date: 10/09/2023  END OF SESSION:  PT End of Session - 10/09/23 0842     Visit Number 3    Number of Visits 12    Date for PT Re-Evaluation 01/01/24    Authorization Type FOTO.    PT Start Time [id number]             Past Medical History:  Diagnosis Date   Arthritis    psoriatic arthritis   Bladder mass    Borderline diabetes    Past Surgical History:  Procedure Laterality Date   [facility]ERV[facility]  [facility]   HERNIA REPAIR  2000   inguinal hernia   REPLACEMENT TOTAL KNEE BILATERAL  2014 and 2015   ROTATOR CUFF REPAIR Right 05/11/2020   SPINAL FUSION  2017   C 1 to C5 and T 1   TRANSURETHRAL RESECTION OF BLADDER TUMOR N/A 07/24/2020   Procedure: TRANSURETHRAL RESECTION OF BLADDER TUMOR (TURBT)/ CYSTOSCOPY/ INSTILLATION OF GEMCITABINE;  Surgeon: Jannifer Hick, MD;  Location: [facility];  Service: Urology;  Laterality: N/A;  ONLY NEEDS 30 MIN FOR PROCEDURE   Patient Active Problem List   Diagnosis Date Noted   Psoriasis 01/02/2014    REFERRING PROVIDER: Doyce Loose NP.  REFERRING DIAG: Low back pain with right-sided sciatica.  Rationale for Evaluation and Treatment: Rehabilitation  THERAPY DIAG:  Other low back pain  Other muscle spasm  ONSET DATE: ~2 months  SUBJECTIVE:                                                                                                                                                                                           SUBJECTIVE STATEMENT: MD ordered MRI. 6-7/10 LBP right now. Not getting much relief  PERTINENT HISTORY:  See above.  PAIN:  Are you having pain? Yes: NPRS scale: 6-7/10 Pain location: Right low back, buttock and down length of right LE. Pain description: Electric, throbbing, sharp and numb. Aggravating factors: As above. Relieving  factors: as above.    PRECAUTIONS: None  RED FLAGS: None   WEIGHT BEARING RESTRICTIONS: No  FALLS:  Has patient fallen in last 6 months? Yes. Number of falls Stepped in a hole and fell which he wonders if this contributed to his back pain.  Also, goff tractor and twisted and fell.    LIVING ENVIRONMENT: Lives with: lives with their spouse Lives in: House/apartment Has following equipment at home: None  OCCUPATION: Retired.  PLOF: Independent  PATIENT GOALS: Get  back to where he was before onset of pain.   OBJECTIVE:  Note: Objective measures were completed at Evaluation unless otherwise noted.  DIAGNOSTIC FINDINGS: X-ray:  09/26/23:  1.No acute fracture. 2. Grade 1 anterolisthesis at L4-L5. Trace anterolisthesis at L2-L3. 3. Mild dextrocurvature. 4. Multilevel degenerative disc changes, moderate at L3-L4, and mild in the remaining levels. 5. Moderate multilevel facet hypertrophy. 6. Mild degenerative changes of the sacroiliac joints.    POSTURE: rounded shoulders, forward head, decreased lumbar lordosis, and flexed trunk   PALPATION: Very tender to palpation over his right SIJ and gluteal musculature.  LUMBAR ROM:  Active trunk flexion decreased by 50% and extension is 10 degrees.  LOWER EXTREMITY MMT:   Normal LE strength.  LUMBAR SPECIAL TESTS:  (+) right SLR and FABER test.  Right LE significantly shorter than left (possibly related to a posterior rotation of the pelvis on the right).  OBSERVATION:  Redness in left low back due to psoriasis    GAIT: The patient is walking in a flexed trunk posture in obvious pain.  TODAY'S TREATMENT:                                                                                                                              DATE:10-05-23 Discussed movement patterns to decrease pain triggers with transitional movements  and ADL's [place] combo x 10 mins 1.5 w/cm2 to LB paras in LT side lying Manual STW to Bil. LB paras and RT  SIJ in LT side lying   HMP and IFC at 80-150 Hz on 40% scan x 15  minutes to patient's right low back and gluteal region.   Normal modality response following removal of modality.   PATIENT EDUCATION:  Education details:  Person educated:  International aid/development worker:  Education comprehension:   HOME EXERCISE PROGRAM:   ASSESSMENT:  CLINICAL IMPRESSION: Pt arrived today doing fair with LBP 5/10. 8/10 yesterday. Rx focused on discussion and review of ADL's and movement modifications to decrease pain triggers. [place] combo as well as STW were  performed to decrease pain and mm tension in LB paras and Bil. SIJ's. Decreased pain end of session.   OBJECTIVE IMPAIRMENTS: Abnormal gait, decreased activity tolerance, difficulty walking, decreased ROM, increased muscle spasms, postural dysfunction, and pain.   ACTIVITY LIMITATIONS: carrying, lifting, bending, standing, and locomotion level  PARTICIPATION LIMITATIONS: meal prep, cleaning, laundry, shopping, community activity, and yard work  [facility] POTENTIAL: Good  CLINICAL DECISION MAKING: Evolving/moderate complexity  EVALUATION COMPLEXITY: Low   GOALS:  SHORT TERM GOALS: Target date: 10/17/23.  Ind with an independent HEP. Goal status: INITIAL   LONG TERM GOALS: Target date: 12/31/22  Ind with an advanced HEP.  Goal status: INITIAL  2.  Eliminate right LE pain/symptoms.  Goal status: INITIAL  3.  Walk a community distance with pain not > 3/10.  Goal status: INITIAL  4.  Perform ADL's with pain not > 3/10.  Goal status: INITIAL  PLAN:  PT FREQUENCY: 2x/week  PT DURATION: 6 weeks  PLANNED INTERVENTIONS: 97110-Therapeutic exercises, 97530- Therapeutic activity, [id number]- Neuromuscular re-education, 97535- Self Care, 96295- Manual therapy, 97014- Electrical stimulation (unattended), 97035- Ultrasound, 28413- Traction (mechanical), Patient/Family education, Dry Needling, Cryotherapy, and Moist heat.  PLAN FOR NEXT SESSION: Combo  e'stim/US, STW/M, Core exercise progression, spinal protection techniques and body mechanics training.  Reverse muscle energy technique on right to help equalize leg lengths, standing hip extension.  Consider intermittent lumbar traction beginning at 35% body weight.   Amberly Livas,CHRIS, PTA 10/09/2023, 8:48 AM

## 2023-10-10 ENCOUNTER — Encounter: Payer: Self-pay | Admitting: *Deleted

## 2023-10-10 ENCOUNTER — Ambulatory Visit: Payer: Medicare Other | Admitting: *Deleted

## 2023-10-10 DIAGNOSIS — M5459 Other low back pain: Secondary | ICD-10-CM | POA: Diagnosis not present

## 2023-10-10 DIAGNOSIS — M62838 Other muscle spasm: Secondary | ICD-10-CM

## 2023-10-10 NOTE — Therapy (Signed)
+ OUTPATIENT PHYSICAL THERAPY THORACOLUMBAR TREATMENT   Patient Name: Marvin Clarke MRN: [id number] DOB:09/20/1956, 67 y.o., male Today's Date: 10/10/2023  END OF SESSION:  PT End of Session - 10/10/23 0757     Visit Number 4    Number of Visits 12    Date for PT Re-Evaluation 01/01/24    Authorization Type FOTO.    PT Start Time 0757    PT Stop Time 0849    PT Time Calculation (min) 52 min             Past Medical History:  Diagnosis Date   Arthritis    psoriatic arthritis   Bladder mass    Borderline diabetes    Past Surgical History:  Procedure Laterality Date   [facility]  [facility]   HERNIA REPAIR  2000   inguinal hernia   REPLACEMENT TOTAL KNEE BILATERAL  2014 and 2015   ROTATOR CUFF REPAIR Right 05/11/2020   SPINAL FUSION  2017   C 1 to C5 and T 1   TRANSURETHRAL RESECTION OF BLADDER TUMOR N/A 07/24/2020   Procedure: TRANSURETHRAL RESECTION OF BLADDER TUMOR (TURBT)/ CYSTOSCOPY/ INSTILLATION OF GEMCITABINE;  Surgeon: Jannifer Hick, MD;  Location: [facility];  Service: Urology;  Laterality: N/A;  ONLY NEEDS 30 MIN FOR PROCEDURE   Patient Active Problem List   Diagnosis Date Noted   Psoriasis 01/02/2014    REFERRING PROVIDER: Doyce Loose NP.  REFERRING DIAG: Low back pain with right-sided sciatica.  Rationale for Evaluation and Treatment: Rehabilitation  THERAPY DIAG:  Other low back pain  Other muscle spasm  ONSET DATE: ~2 months  SUBJECTIVE:                                                                                                                                                                                           SUBJECTIVE STATEMENT: MD ordered MRI. Tomorrow 5:30 PM. 7-8/10 LBP right now. Not getting much relief  PERTINENT HISTORY:  See above.  PAIN:  Are you having pain? Yes: NPRS scale: 7-8/10 Pain location: Right low back, buttock and down length of right LE. Pain description: Electric,  throbbing, sharp and numb. Aggravating factors: As above. Relieving factors: as above.    PRECAUTIONS: None  RED FLAGS: None   WEIGHT BEARING RESTRICTIONS: No  FALLS:  Has patient fallen in last 6 months? Yes. Number of falls Stepped in a hole and fell which he wonders if this contributed to his back pain.  Also, goff tractor and twisted and fell.    LIVING ENVIRONMENT: Lives with: lives with their spouse  Lives in: House/apartment Has following equipment at home: None  OCCUPATION: Retired.  PLOF: Independent  PATIENT GOALS: Get back to where he was before onset of pain.   OBJECTIVE:  Note: Objective measures were completed at Evaluation unless otherwise noted.  DIAGNOSTIC FINDINGS: X-ray:  09/26/23:  1.No acute fracture. 2. Grade 1 anterolisthesis at L4-L5. Trace anterolisthesis at L2-L3. 3. Mild dextrocurvature. 4. Multilevel degenerative disc changes, moderate at L3-L4, and mild in the remaining levels. 5. Moderate multilevel facet hypertrophy. 6. Mild degenerative changes of the sacroiliac joints.    POSTURE: rounded shoulders, forward head, decreased lumbar lordosis, and flexed trunk   PALPATION: Very tender to palpation over his right SIJ and gluteal musculature.  LUMBAR ROM:  Active trunk flexion decreased by 50% and extension is 10 degrees.  LOWER EXTREMITY MMT:   Normal LE strength.  LUMBAR SPECIAL TESTS:  (+) right SLR and FABER test.  Right LE significantly shorter than left (possibly related to a posterior rotation of the pelvis on the right).  OBSERVATION:  Redness in left low back due to psoriasis    GAIT: The patient is walking in a flexed trunk posture in obvious pain.  TODAY'S TREATMENT:                                                                                                                              DATE: 10-10-23  [place] combo x 12 mins 1.5 w/cm2 to RT side SIJ and  paras in LT side lying Manual STW to Bil. LB paras and RT SIJ in  LT side lying with focus on SIJ  HMP and IFC at 80-150 Hz on 40% scan x 15  minutes to patient's right low back and gluteal region.   Normal modality response following removal of modality.   PATIENT EDUCATION:  Education details:  Person educated:  International aid/development worker:  Education comprehension:   HOME EXERCISE PROGRAM:   ASSESSMENT:  CLINICAL IMPRESSION: Pt arrived today reporting MRI set up for tomorrow 5:30. He had high pain levels today 7-8/10 RT SIJ mainly and appeared to be the pain generator.  [place] combo as well as STW were  performed to decrease pain and mm tension in LB paras and Bil. SIJ's. Decreased pain end of session. Goals ongoing due to pain   OBJECTIVE IMPAIRMENTS: Abnormal gait, decreased activity tolerance, difficulty walking, decreased ROM, increased muscle spasms, postural dysfunction, and pain.   ACTIVITY LIMITATIONS: carrying, lifting, bending, standing, and locomotion level  PARTICIPATION LIMITATIONS: meal prep, cleaning, laundry, shopping, community activity, and yard work  [facility] POTENTIAL: Good  CLINICAL DECISION MAKING: Evolving/moderate complexity  EVALUATION COMPLEXITY: Low   GOALS:  SHORT TERM GOALS: Target date: 10/17/23.  Ind with an independent HEP. Goal status: On going   LONG TERM GOALS: Target date: 12/31/22  Ind with an advanced HEP.  Goal status: On going  2.  Eliminate right LE pain/symptoms.  Goal status: On going  3.  Walk a community  distance with pain not > 3/10.  Goal status: On going  4.  Perform ADL's with pain not > 3/10.  Goal status: On going  PLAN:  PT FREQUENCY: 2x/week  PT DURATION: 6 weeks  PLANNED INTERVENTIONS: 97110-Therapeutic exercises, 97530- Therapeutic activity, [id number]- Neuromuscular re-education, 97535- Self Care, 10272- Manual therapy, 97014- Electrical stimulation (unattended), 97035- Ultrasound, 53664- Traction (mechanical), Patient/Family education, Dry Needling, Cryotherapy, and Moist heat.  PLAN  FOR NEXT SESSION: Combo e'stim/US, STW/M, Core exercise progression, spinal protection techniques and body mechanics training.  Reverse muscle energy technique on right to help equalize leg lengths, standing hip extension.  Consider intermittent lumbar traction beginning at 35% body weight.   Trinette Vera,CHRIS, PTA 10/10/2023, 1:11 PM

## 2024-02-22 ENCOUNTER — Telehealth: Payer: Self-pay

## 2024-02-22 NOTE — Transitions of Care (Post Inpatient/ED Visit) (Signed)
 02/22/2024  Name: Marvin Clarke MRN: [id number] DOB: 25-Mar-1956  Today's TOC FU Call Status: Today's TOC FU Call Status:: Successful TOC FU Call Completed TOC FU Call Complete Date: 02/22/24 Patient's Name and Date of Birth confirmed.  Transition Care Management Follow-up Telephone Call Date of Discharge: 02/21/24 Discharge Facility[facility]) [facility]: [facility] Type of Discharge: Inpatient Admission How have you been since you were released from the hospital?: Worse (More pain.  Reports he has a long recovery) Any questions or concerns?: No  Items Reviewed: Did you receive and understand the discharge instructions provided?: Yes Medications obtained,verified, and reconciled?: Yes (Medications Reviewed) Any new allergies since your discharge?: No Dietary orders reviewed?: NA Do you have support at home?: Yes People in Home [RPT]: spouse Name of Support/Comfort Primary Source: Jasmine December  Medications Reviewed Today: Medications Reviewed Today     Reviewed by Earlie Server, RN (Registered Nurse) on 02/22/24 at [phone]  Med List Status: <None>   Medication Order Taking? Sig Documenting Provider Last Dose Status Informant  acetaminophen (TYLENOL) 500 MG tablet [id number] Yes Take 500 mg by mouth every 6 (six) hours as needed. [provider] Taking Active Self  aluminum hydroxide-magnesium carbonate (GAVISCON) 95-358 MG/15ML SUSP [id number] Yes Take by mouth. [provider] Taking Active   ascorbic acid (VITAMIN C) 500 MG tablet [id number] Yes Take 500 mg by mouth daily. [provider] Taking Active   azithromycin (ZITHROMAX Z-PAK) 250 MG tablet [id number] No As directed  Patient not taking: Reported on 02/22/2024   Bennie Pierini, FNP Not Taking Active   benzonatate (TESSALON PERLES) 100 MG capsule [id number] No Take 1 capsule (100 mg total) by mouth 3 (three) times daily as needed for cough.  Patient not  taking: Reported on 02/22/2024   Bennie Pierini, FNP Not Taking Active   calcium carbonate (OS-CAL) 1250 (500 Ca) MG chewable tablet [id number] Yes Chew 1 tablet by mouth 2 (two) times daily. [provider] Taking Active   gabapentin (NEURONTIN) 300 MG capsule [id number] Yes Take 600 mg by mouth 3 (three) times daily. [provider] Taking Active   methocarbamol (ROBAXIN) 750 MG tablet [id number] Yes Take 750 mg by mouth 4 (four) times daily. For 10 days [provider] Taking Active   morphine (MSIR) 15 MG tablet [id number] No Take 0.5 tablets (7.5 mg total) by mouth every 4 (four) hours as needed for severe pain.  Patient not taking: Reported on 02/22/2024   Melene Plan, DO Not Taking Active   olopatadine (PATADAY) 0.1 % ophthalmic solution [id number] No Place 1 drop into both eyes 2 (two) times daily.  Patient not taking: Reported on 02/22/2024   Sonny Masters, FNP Not Taking Active   oxycodone (OXY-IR) 5 MG capsule [id number] Yes Take 5 mg by mouth every 4 (four) hours as needed for pain. 1-2 tablets every 4 hours for severe pain [provider] Taking Active   polyethylene glycol (MIRALAX / GLYCOLAX) 17 g packet [id number] Yes Take 17 g by mouth daily. [provider] Taking [address]   Merlyn Albert [facility] PEN [facility]) [id number] Yes Inject into the skin. [provider] Taking Active   sennosides-docusate sodium (SENOKOT-S) 8.6-50 MG tablet [id number] Yes Take 1 tablet by mouth daily. [provider] Taking Active   trimethoprim-polymyxin b (POLYTRIM) ophthalmic solution [id number] No Place 1 drop into the right eye every 4 (four) hours.  Patient not taking: Reported on 02/22/2024   Melene Plan, DO Not Taking  Active             Home Care and Equipment/Supplies: Were Home Health Services Ordered?: No Any new equipment or medical supplies ordered?: No  Functional Questionnaire: Do you need assistance with bathing/showering or  dressing?: Yes (wife assist due to back surgery) Do you need assistance with meal preparation?: Yes (wife assist due to back surgery) Do you need assistance with eating?: No Do you have difficulty maintaining continence: No Do you need assistance with getting out of bed/getting out of a chair/moving?: No Do you have difficulty managing or taking your medications?: No  Follow up appointments reviewed: PCP Follow-up appointment confirmed?: No (Patient has not seen PCP since 11/2022. Encouraged patient to call and make an appointment for an AWV and hospital follow up. he is undecided.) MD Provider Line [phone] Given: No Specialist Hospital Follow-up appointment confirmed?: Yes Date of Specialist follow-up appointment?: 03/07/24 Follow-Up Specialty Provider:: Spine NP at [facility] Do you need transportation to your follow-up appointment?: No Do you understand care options if your condition(s) worsen?: Yes-patient verbalized understanding  SDOH Interventions Today    Flowsheet Row Most Recent Value  SDOH Interventions   Housing Interventions Intervention Not Indicated  Transportation Interventions Intervention Not Indicated  Utilities Interventions Intervention Not Indicated      Spoke with patient and verbally reviewed all discharge instruction including medications and restrictions. Patient voiced understanding.  Reviewed wound care. Reviewed importance of follow up appointments. Encouraged patient to see PCP. Last visit 1 plus years ago.   Reviewed with patient when to call MD.  Reviewed and offered 30 TOC program and patient declined.  Lonia Chimera, RN, BSN, CEN [employer]- Transition of Care Team.  [facility] [phone]

## 2024-04-09 ENCOUNTER — Encounter: Payer: Self-pay | Admitting: Family Medicine

## 2024-04-09 ENCOUNTER — Ambulatory Visit (INDEPENDENT_AMBULATORY_CARE_PROVIDER_SITE_OTHER): Admitting: Family Medicine

## 2024-04-09 ENCOUNTER — Ambulatory Visit: Payer: Self-pay

## 2024-04-09 VITALS — BP 126/75 | HR 65 | Temp 97.3°F | Ht 71.0 in | Wt 218.2 lb

## 2024-04-09 DIAGNOSIS — R0602 Shortness of breath: Secondary | ICD-10-CM

## 2024-04-09 DIAGNOSIS — Z1331 Encounter for screening for depression: Secondary | ICD-10-CM | POA: Diagnosis not present

## 2024-04-09 DIAGNOSIS — R051 Acute cough: Secondary | ICD-10-CM

## 2024-04-09 DIAGNOSIS — K148 Other diseases of tongue: Secondary | ICD-10-CM

## 2024-04-09 MED ORDER — BENZONATATE 100 MG PO CAPS: 100.0000 mg | ORAL_CAPSULE | Freq: Three times a day (TID) | ORAL | 0 refills | Status: DC | PRN

## 2024-04-09 MED ORDER — AMOXICILLIN-POT CLAVULANATE 875-125 MG PO TABS: 1.0000 | ORAL_TABLET | Freq: Two times a day (BID) | ORAL | 0 refills | Status: AC

## 2024-04-09 MED ORDER — ALBUTEROL SULFATE HFA 108 (90 BASE) MCG/ACT IN AERS: 2.0000 | INHALATION_SPRAY | Freq: Four times a day (QID) | RESPIRATORY_TRACT | 0 refills | Status: AC | PRN

## 2024-04-09 NOTE — Telephone Encounter (Signed)
 Copied from CRM [id number]. Topic: Clinical - Red Word Triage >> Apr 09, 2024  9:44 AM Retta Caster wrote: Red Word that prompted transfer to Nurse Triage: Patient Chest congestion/Cold with 1 week/Trouble breathing/Runny nose  Chief Complaint: cough, congestion, cp states in lung, runny nose Symptoms: see above Frequency: for 1 week Pertinent Negatives: Patient denies fever, wheezing Disposition: [] ED /[] Urgent Care (no appt availability in office) / [x] Appointment(In office/virtual)/ []  [facility]/ [] Home Care/ [] Refused Recommended Disposition /[] [facility]/ []  Follow-up with PCP Additional Notes: apt made for today; care advice given, denies questions; instructed to go to ER if becomes worse.   Reason for Disposition  [1] MILD difficulty breathing (e.g., minimal/no SOB at rest, SOB with walking, pulse <100) AND [2] still present when not coughing  Answer Assessment - Initial Assessment Questions 1. ONSET: "When did the cough begin?"      1 week 2. SEVERITY: "How bad is the cough today?"      mild 3. SPUTUM: "Describe the color of your sputum" (none, dry cough; clear, white, yellow, green)     denies 4. HEMOPTYSIS: "Are you coughing up any blood?" If so ask: "How much?" (flecks, streaks, tablespoons, etc.)     denies 5. DIFFICULTY BREATHING: "Are you having difficulty breathing?" If Yes, ask: "How bad is it?" (e.g., mild, moderate, severe)    - MILD: No SOB at rest, mild SOB with walking, speaks normally in sentences, can lie down, no retractions, pulse < 100.    - MODERATE: SOB at rest, SOB with minimal exertion and prefers to sit, cannot lie down flat, speaks in phrases, mild retractions, audible wheezing, pulse 100-120.    - SEVERE: Very SOB at rest, speaks in single words, struggling to breathe, sitting hunched forward, retractions, pulse > 120      Mild to moderate 6. FEVER: "Do you have a fever?" If Yes, ask: "What is your temperature, how was it measured, and  when did it start?"     denies 7. CARDIAC HISTORY: "Do you have any history of heart disease?" (e.g., heart attack, congestive heart failure)      no 8. LUNG HISTORY: "Do you have any history of lung disease?"  (e.g., pulmonary embolus, asthma, emphysema)     no 9. PE RISK FACTORS: "Do you have a history of blood clots?" (or: recent major surgery, recent prolonged travel, bedridden)     no 10. OTHER SYMPTOMS: "Do you have any other symptoms?" (e.g., runny nose, wheezing, chest pain)       Runny nose, cp on right side 11. PREGNANCY: "Is there any chance you are pregnant?" "When was your last menstrual period?"       na 12. TRAVEL: "Have you traveled out of the country in the last month?" (e.g., travel history, exposures)       no  Protocols used: Cough - Acute Productive-A-AH

## 2024-04-09 NOTE — Telephone Encounter (Signed)
 Appt made for today

## 2024-04-09 NOTE — Progress Notes (Signed)
 Subjective:  Patient ID: Marvin Clarke, male    DOB: 1956/06/14, 68 y.o.   MRN: [id number]  Patient Care Team: Delfina Feller, FNP as PCP - General (Nurse Practitioner)   Chief Complaint:  Cough, Nasal Congestion, and Shortness of Breath (X 1 week - otc flu medication )  HPI: Marvin Clarke is a 68 y.o. male presenting on 04/09/2024 for Cough, Nasal Congestion, and Shortness of Breath (X 1 week - otc flu medication )  Cough Associated symptoms include shortness of breath.  Shortness of Breath   States that symptoms started over one week ago.  Reports that he is dry coughing, cannot sleep. Has right sided chest pain. He is taking multiple medications OTC and not helping. Reports that he had a temperature of 99.2 at the start of his symptoms. Reports fatigue, rhinorrhea, shortness of breath. Denies N/V. Endorses sinus pressure.   Tongue lesion  States that he has tongue lesion present from surgery in December. Reports that he has followed up with surgeon and they have recommended to watch it.   Relevant past medical, surgical, family, and social history reviewed and updated as indicated.  Allergies and medications reviewed and updated. Data reviewed: Chart in Epic.   Past Medical History:  Diagnosis Date   Arthritis    psoriatic arthritis   Bladder mass    Borderline diabetes     Past Surgical History:  Procedure Laterality Date   CERVICAL SPINE SURGERY  2011   [facility]   HERNIA REPAIR  2000   inguinal hernia   REPLACEMENT TOTAL KNEE BILATERAL  2014 and 2015   ROTATOR CUFF REPAIR Right 05/11/2020   SPINAL FUSION  2017   C 1 to C5 and T 1   TRANSURETHRAL RESECTION OF BLADDER TUMOR N/A 07/24/2020   Procedure: TRANSURETHRAL RESECTION OF BLADDER TUMOR (TURBT)/ CYSTOSCOPY/ INSTILLATION OF GEMCITABINE ;  Surgeon: Lahoma Pigg, MD;  Location: [facility];  Service: Urology;  Laterality: N/A;  ONLY NEEDS 30 MIN FOR PROCEDURE    Social History    Socioeconomic History   Marital status: Married    Spouse name: Not on file   Number of children: Not on file   Years of education: Not on file   Highest education level: Not on file  Occupational History   Not on file  Tobacco Use   Smoking status: Never   Smokeless tobacco: Former    Types: Chew    Quit date: 11/14/1978  Vaping Use   Vaping status: Never Used  Substance and Sexual Activity   Alcohol use: No   Drug use: No   Sexual activity: Not on file  Other Topics Concern   Not on file  Social History Narrative   Not on file   Social Drivers of Health   Financial Resource Strain: Not on file  Food Insecurity: No Food Insecurity (02/22/2024)   Hunger Vital Sign    Worried About Running Out of Food in the Last Year: Never true    Ran Out of Food in the Last Year: Never true  Transportation Needs: No Transportation Needs (02/22/2024)   PRAPARE - Administrator, Civil Service (Medical): No    Lack of Transportation (Non-Medical): No  Physical Activity: Not on file  Stress: Not on file  Social Connections: Not on file  Intimate Partner Violence: Not At Risk (02/22/2024)   Humiliation, Afraid, Rape, and Kick questionnaire    Fear of Current or Ex-Partner:  No    Emotionally Abused: No    Physically Abused: No    Sexually Abused: No    Outpatient Encounter Medications as of 04/09/2024  Medication Sig   acetaminophen  (TYLENOL ) 500 MG tablet Take 500 mg by mouth every 6 (six) hours as needed.   aluminum hydroxide-magnesium carbonate (GAVISCON) 95-358 MG/15ML SUSP Take by mouth. (Patient not taking: Reported on 04/09/2024)   ascorbic acid (VITAMIN C) 500 MG tablet Take 500 mg by mouth daily. (Patient not taking: Reported on 04/09/2024)   calcium carbonate (OS-CAL) 1250 (500 Ca) MG chewable tablet Chew 1 tablet by mouth 2 (two) times daily. (Patient not taking: Reported on 04/09/2024)   gabapentin (NEURONTIN) 300 MG capsule Take 600 mg by mouth 3 (three) times daily.  (Patient not taking: Reported on 04/09/2024)   methocarbamol (ROBAXIN) 750 MG tablet Take 750 mg by mouth 4 (four) times daily. For 10 days (Patient not taking: Reported on 04/09/2024)   morphine  (MSIR) 15 MG tablet Take 0.5 tablets (7.5 mg total) by mouth every 4 (four) hours as needed for severe pain. (Patient not taking: Reported on 04/09/2024)   olopatadine  (PATADAY ) 0.1 % ophthalmic solution Place 1 drop into both eyes 2 (two) times daily. (Patient not taking: Reported on 04/09/2024)   oxycodone  (OXY-IR) 5 MG capsule Take 5 mg by mouth every 4 (four) hours as needed for pain. 1-2 tablets every 4 hours for severe pain (Patient not taking: Reported on 04/09/2024)   polyethylene glycol (MIRALAX / GLYCOLAX) 17 g packet Take 17 g by mouth daily. (Patient not taking: Reported on 04/09/2024)   Risankizumab-rzaa (SKYRIZI PEN [place]) Inject into the skin. (Patient not taking: Reported on 04/09/2024)   sennosides-docusate sodium  (SENOKOT-S) 8.6-50 MG tablet Take 1 tablet by mouth daily. (Patient not taking: Reported on 04/09/2024)   trimethoprim -polymyxin b  (POLYTRIM ) ophthalmic solution Place 1 drop into the right eye every 4 (four) hours. (Patient not taking: Reported on 04/09/2024)   [DISCONTINUED] azithromycin  (ZITHROMAX  Z-PAK) 250 MG tablet As directed (Patient not taking: Reported on 02/22/2024)   [DISCONTINUED] benzonatate  (TESSALON  PERLES) 100 MG capsule Take 1 capsule (100 mg total) by mouth 3 (three) times daily as needed for cough. (Patient not taking: Reported on 02/22/2024)   No facility-administered encounter medications on file as of 04/09/2024.    No Known Allergies  Review of Systems  Respiratory:  Positive for cough and shortness of breath.    Objective:  BP 126/75   Pulse 65   Temp (!) 97.3 F (36.3 C)   Ht 5\' 11"  (1.803 m)   Wt 218 lb 3.2 oz (99 kg)   SpO2 99%   BMI 30.43 kg/m    Wt Readings from Last 3 Encounters:  04/09/24 218 lb 3.2 oz (99 kg)  02/22/24 210 lb (95.3 kg)  02/09/23  225 lb 15.5 oz (102.5 kg)   Physical Exam Constitutional:      General: He is awake. He is not in acute distress.    Appearance: Normal appearance. He is well-developed and well-groomed. He is obese. He is not ill-appearing, toxic-appearing or diaphoretic.  HENT:     Right Ear: There is impacted cerumen.     Left Ear: No tenderness. A middle ear effusion is present. Tympanic membrane is injected. Tympanic membrane is not scarred, perforated or erythematous.     Nose: Congestion and rhinorrhea present. Rhinorrhea is clear.     Left Nostril: Occlusion present.     Right Turbinates: Enlarged.     Left Turbinates: Enlarged.  Right Sinus: Maxillary sinus tenderness present. No frontal sinus tenderness.     Left Sinus: Maxillary sinus tenderness present. No frontal sinus tenderness.     Mouth/Throat:     Lips: Pink. No lesions.     Mouth: Mucous membranes are moist.     Tongue: Lesions present.     Pharynx: Posterior oropharyngeal erythema and postnasal drip present. No oropharyngeal exudate or uvula swelling.      Comments: Brown/black bruising along posterior aspect of tongue  Cardiovascular:     Rate and Rhythm: Normal rate and regular rhythm.     Pulses: Normal pulses.          Radial pulses are 2+ on the right side and 2+ on the left side.       Posterior tibial pulses are 2+ on the right side and 2+ on the left side.     Heart sounds: Normal heart sounds. No murmur heard.    No gallop.  Pulmonary:     Effort: Pulmonary effort is normal. No respiratory distress.     Breath sounds: Normal breath sounds. No stridor. No wheezing, rhonchi or rales.  Musculoskeletal:     Cervical back: Full passive range of motion without pain and neck supple.     Right lower leg: No edema.     Left lower leg: No edema.  Lymphadenopathy:     Head:     Right side of head: No submental, submandibular, tonsillar, preauricular or posterior auricular adenopathy.     Left side of head: No submental,  submandibular, tonsillar, preauricular or posterior auricular adenopathy.     Cervical:     Right cervical: No superficial cervical adenopathy.    Left cervical: No superficial cervical adenopathy.  Skin:    General: Skin is warm.     Capillary Refill: Capillary refill takes less than 2 seconds.  Neurological:     General: No focal deficit present.     Mental Status: He is alert, oriented to person, place, and time and easily aroused. Mental status is at baseline.     GCS: GCS eye subscore is 4. GCS verbal subscore is 5. GCS motor subscore is 6.     Motor: No weakness.  Psychiatric:        Attention and Perception: Attention and perception normal.        Mood and Affect: Mood and affect normal.        Speech: Speech normal.        Behavior: Behavior normal. Behavior is cooperative.        Thought Content: Thought content normal. Thought content does not include homicidal or suicidal ideation. Thought content does not include homicidal or suicidal plan.        Cognition and Memory: Cognition and memory normal.        Judgment: Judgment normal.      Results for orders placed or performed during the hospital encounter of 07/24/20  Surgical pathology   Collection Time: 07/24/20  2:05 PM  Result Value Ref Range   SURGICAL PATHOLOGY      SURGICAL PATHOLOGY CASE: [id number] PATIENT: [facility] Surgical Pathology Report     Clinical History: Bladder mass (crm)     FINAL MICROSCOPIC DIAGNOSIS:  A. BLADDER, TRIGONE, TURBT: Inverted urothelial papilloma  B. BLADDER, TRIGONE, DEEP, TURBT: Inverted urothelial papilloma Muscularis propria (detrusor muscle) is present and not involved   GROSS DESCRIPTION:  A: The specimen is received in formalin and consists of a 3.1 x  1.8 x 0.6 cm aggregate of tan-pink, papillary soft tissue.  The specimen is entirely submitted in 2 cassettes.  B: The specimen is received in formalin and consists of a 1.0 x 1.0 x 0.2 cm aggregate of  tan-pink soft tissue fragments.  The specimen is entirely submitted in 1 cassette. Jeffrey Mini 07/27/2020)    Final Diagnosis performed by Zhaoli Lane, MD.   Electronically signed 07/27/2020 Technical and / or Professional components performed at [facility], [address] [place], [place] [address].  Immunohis Barrister's clerk component (if applicable) was performed at [facility]. [address], [place], [place] [address].   IMMUNOHISTOCHEMISTRY DISCLAIMER (if applicable): Some of these immunohistochemical stains may have been developed and the performance characteristics determine by [facility]. Some may not have been cleared or approved by the U.S. Food and Drug Administration. The FDA has determined that such clearance or approval is not necessary. This test is used for clinical purposes. It should not be regarded as investigational or for research. This laboratory is certified under the Clinical Laboratory Improvement Amendments of 1988 (CLIA-88) as qualified to perform high complexity clinical laboratory testing.  The controls stained appropriately.        04/09/2024   11:40 AM 11/22/2022    9:27 AM 11/15/2022   11:27 AM 04/29/2022    8:40 AM 09/24/2021    2:30 PM  Depression screen PHQ 2/9  Decreased Interest 3 0 0 0 0  Down, Depressed, Hopeless 0 0 0 0 0  PHQ - 2 Score 3 0 0 0 0  Altered sleeping 3 0 0 0 0  Tired, decreased energy 3 0 0 0 0  Change in appetite 3 0 0 0 0  Feeling bad or failure about yourself  0 0 0 0 0  Trouble concentrating 0 0 0 0 0  Moving slowly or fidgety/restless 0 0 0 0 0  Suicidal thoughts 0 0 0 0 0  PHQ-9 Score 12 0 0 0 0  Difficult doing work/chores Not difficult at all Not difficult at all Not difficult at all Not difficult at all Not difficult at all      04/09/2024   11:41 AM 11/22/2022    9:27 AM 04/29/2022    8:40 AM 09/24/2021    2:30 PM  GAD 7 : Generalized Anxiety Score   Nervous, Anxious, on Edge 0 0 0 0  Control/stop worrying 0 0 0 0  Worry too much - different things 0 0 0 0  Trouble relaxing 0 0 0 0  Restless 0 0 0 0  Easily annoyed or irritable 0 0 0 0  Afraid - awful might happen 0 0 0 0  Total GAD 7 Score 0 0 0 0  Anxiety Difficulty Not difficult at all Not difficult at all Not difficult at all Not difficult at all   Pertinent labs & imaging results that were available during my care of the patient were reviewed by me and considered in my medical decision making.  Assessment & Plan:  Marvin Clarke was seen today for cough, nasal congestion and shortness of breath.  Diagnoses and all orders for this visit:  1. Acute cough (Primary) Will start medication as below. Discussed with patient red flag symptoms and when to return for further evaluation. Discussed at home care such as humidifier, saline spray, throat lozenges, increasing hydration, and tylenol  for pain or fever.  - benzonatate  (TESSALON  PERLES) 100 MG capsule; Take 1 capsule (100 mg  total) by mouth 3 (three) times daily as needed.  Dispense: 20 capsule; Refill: 0 - amoxicillin-clavulanate (AUGMENTIN) 875-125 MG tablet; Take 1 tablet by mouth 2 (two) times daily for 7 days.  Dispense: 14 tablet; Refill: 0 - albuterol (VENTOLIN HFA) 108 (90 Base) MCG/ACT inhaler; Inhale 2 puffs into the lungs every 6 (six) hours as needed for wheezing or shortness of breath.  Dispense: 8 g; Refill: 0  2. Shortness of breath As above.  - albuterol (VENTOLIN HFA) 108 (90 Base) MCG/ACT inhaler; Inhale 2 puffs into the lungs every 6 (six) hours as needed for wheezing or shortness of breath.  Dispense: 8 g; Refill: 0  3. Encounter for screening for depression Patient screening positive for depression. However, he states that he is not depressed and that symptoms are related to current illness. Patient to notify provider if status changes and he would like to seek pharmacologic or non-pharmacologic therapy for depression.    4. Tongue lesion Per patient report followed with surgeons for tongue lesion. Discussed with patient that if it does not improve or heal that he needs to follow up with dentistry/ENT. There is no mention of lesion in recent note 04/03/24 with Whitman Hampshire, MD   Continue all other maintenance medications.  Follow up plan: Return if symptoms worsen or fail to improve.   Continue healthy lifestyle choices, including diet (rich in fruits, vegetables, and lean proteins, and low in salt and simple carbohydrates) and exercise (at least 30 minutes of moderate physical activity daily).  Written and verbal instructions provided   The above assessment and management plan was discussed with the patient. The patient verbalized understanding of and has agreed to the management plan. Patient is aware to call the clinic if they develop any new symptoms or if symptoms persist or worsen. Patient is aware when to return to the clinic for a follow-up visit. Patient educated on when it is appropriate to go to the emergency department.   Jacqualyn Mates, DNP-FNP [facility] [address] [place], [place] [address] [phone]

## 2024-09-19 ENCOUNTER — Ambulatory Visit: Admitting: Family Medicine

## 2024-09-19 ENCOUNTER — Encounter: Payer: Self-pay | Admitting: Family Medicine

## 2024-09-19 ENCOUNTER — Ambulatory Visit: Payer: Self-pay

## 2024-09-19 VITALS — BP 135/71 | HR 67 | Temp 98.0°F | Ht 71.0 in | Wt 217.8 lb

## 2024-09-19 DIAGNOSIS — L0390 Cellulitis, unspecified: Secondary | ICD-10-CM | POA: Diagnosis not present

## 2024-09-19 MED ORDER — DOXYCYCLINE HYCLATE 100 MG PO TABS: 100.0000 mg | ORAL_TABLET | Freq: Two times a day (BID) | ORAL | 0 refills | Status: DC

## 2024-09-19 NOTE — Progress Notes (Signed)
   Acute Office Visit  Subjective:     Patient ID: Marvin Clarke, male    DOB: 1956/02/04, 68 y.o.   MRN: [id number]  Chief Complaint  Patient presents with   Wound Infection    HPI  History of Present Illness   Marvin Clarke is a 68 year old male who presents with a persistent leg wound following a cut from a ladder.  Lower extremity wound - Sustained a laceration to the leg approximately two weeks ago after cutting it on a ladder in his shop - Wound has not improved since injury - Redness has worsened over time - Significant tenderness persists at the wound site - Initial bleeding for about one week, now resolved - No drainage from the wound - No fever reported - No over-the-counter treatments used - No prior medical evaluation for this wound  Localized pain - Pain localized to the area of the cut - Describes sensation as 'just right on that bone going down there' - Able to ambulate 'pretty good' most of the time despite pain       ROS As per HPI.      Objective:    BP 135/71   Pulse 67   Temp 98 F (36.7 C) (Temporal)   Ht 5' 11 (1.803 m)   Wt 217 lb 12.8 oz (98.8 kg)   SpO2 97%   BMI 30.38 kg/m    Physical Exam Vitals and nursing note reviewed.  Constitutional:      General: He is not in acute distress.    Appearance: He is not ill-appearing, toxic-appearing or diaphoretic.  Pulmonary:     Effort: Pulmonary effort is normal. No respiratory distress.  Musculoskeletal:     Right lower leg: No edema.     Left lower leg: No edema.  Skin:    General: Skin is warm and dry.     Findings: Abrasion (left anterior LE with surround erythema, tenderness and warmth. No exudate. Edges approximated) present.  Neurological:     General: No focal deficit present.     Mental Status: He is alert and oriented to person, place, and time.  Psychiatric:        Mood and Affect: Mood normal.        Behavior: Behavior normal.       No results found for any visits  on 09/19/24.      Assessment & Plan:   Marvin Clarke was seen today for wound infection.  Diagnoses and all orders for this visit:  Wound cellulitis -     doxycycline (VIBRA-TABS) 100 MG tablet; Take 1 tablet (100 mg total) by mouth 2 (two) times daily for 7 days.   Assessment and Plan    Wound cellulitis Worsening redness and tenderness suggests infection.  - Prescribed doxycycline twice daily for one week. - Scheduled follow-up in one week to reassess.       1 week, sooner for new or worsening symptoms.   The patient indicates understanding of these issues and agrees with the plan.  Annabella CHRISTELLA Search, FNP

## 2024-09-19 NOTE — Patient Instructions (Signed)

## 2024-09-19 NOTE — Telephone Encounter (Signed)
 Appt made.

## 2024-09-19 NOTE — Telephone Encounter (Signed)
 FYI Only or Action Required?: FYI only for provider: appointment scheduled on 09/19/2024.  Patient was last seen in primary care on 04/09/2024 by Cathlene Marry Lenis, FNP.  Called Nurse Triage reporting Leg Injury.  Symptoms began several weeks ago.  Interventions attempted: Nothing.  Symptoms are: gradually worsening.  Triage Disposition: See Physician Within 24 Hours  Patient/caregiver understands and will follow disposition?: Yes  Copied from CRM #[id number]. Topic: Clinical - Red Word Triage >> Sep 19, 2024  1:18 PM Emylou G wrote: [facility] that prompted transfer to Nurse Triage: fell and hurt left leg ( turning red - in pain ) happened a few weeks ago .SABRA The cut on there isn't healing ( between knee and ankle.. Reason for Disposition  [1] No prior tetanus shots (or is not fully vaccinated) AND [2] any wound (e.g., cut, scrape)  Answer Assessment - Initial Assessment Questions 1. MECHANISM: How did the injury happen? (e.g., twisting injury, direct blow)      Fell off of ladder and hit leg going down 2. ONSET: When did the injury happen? (e.g., minutes, hours ago)      Three weeks ago 3. LOCATION: Where is the injury located?      Left leg inbetween ankle and knee 4. APPEARANCE of INJURY: What does the injury look like?  (e.g., deformity of leg)     Redness to cut, not healing 5. SEVERITY: Can you put weight on that leg? Can you walk?      Able to walk, but painful 6. SIZE: For cuts, bruises, or swelling, ask: How large is it? (e.g., inches or centimeters)      8 inches, red 7. PAIN: Is there pain? If Yes, ask: How bad is the pain?   What does it keep you from doing? (Scale 0-10; or none, mild, moderate, severe)     Hurts pretty bad 8. TETANUS: For any breaks in the skin, ask: When was your last tetanus booster?     Probably not 9. OTHER SYMPTOMS: Do you have any other symptoms?      Denies fever or drainage  Protocols used: Leg Injury-A-AH

## 2024-09-26 ENCOUNTER — Ambulatory Visit: Admitting: Family Medicine

## 2024-09-26 ENCOUNTER — Encounter: Payer: Self-pay | Admitting: Family Medicine

## 2024-09-26 DIAGNOSIS — L0390 Cellulitis, unspecified: Secondary | ICD-10-CM

## 2024-09-26 MED ORDER — DOXYCYCLINE HYCLATE 100 MG PO TABS: 100.0000 mg | ORAL_TABLET | Freq: Two times a day (BID) | ORAL | 0 refills | Status: AC

## 2024-09-26 NOTE — Progress Notes (Signed)
   Acute Office Visit  Subjective:     Patient ID: Marvin Clarke, male    DOB: 02-15-1956, 68 y.o.   MRN: [id number]  Chief Complaint  Patient presents with   Wound Check    Wound Check    History of Present Illness   Marvin Clarke is a 68 year old male who presents with a leg infection.  Lower extremity infection - Currently on doxycycline with one day remaining in the initial course - Significant improvement in pain, tenderness, and redness since starting antibiotics - Minimal stinging type pain present - No drainage from the site - Most of scab that was present has come off - Pressure sensation when the area is touched, but not very painful - No fever         ROS As per HPI.      Objective:    BP 129/68   Pulse 60   Temp 97.6 F (36.4 C) (Temporal)   Ht 5' 11 (1.803 m)   Wt 219 lb 3.2 oz (99.4 kg)   SpO2 97%   BMI 30.57 kg/m    Physical Exam Vitals and nursing note reviewed.  Constitutional:      General: Marvin Clarke is not in acute distress.    Appearance: Marvin Clarke is not ill-appearing, toxic-appearing or diaphoretic.  Pulmonary:     Effort: Pulmonary effort is normal. No respiratory distress.  Musculoskeletal:     Right lower leg: No edema.     Left lower leg: No edema.  Skin:    General: Skin is warm and dry.     Comments: Scabbing present to anteiror LLE with minimal erythema, tenderness. No exudate or warmth. Pictured below.   Neurological:     General: No focal deficit present.     Mental Status: Marvin Clarke is alert and oriented to person, place, and time.  Psychiatric:        Mood and Affect: Mood normal.        Behavior: Behavior normal.     No results found for any visits on 09/26/24.      Assessment & Plan:   Marvin Clarke was seen today for wound check.  Diagnoses and all orders for this visit:  Wound cellulitis -     doxycycline (VIBRA-TABS) 100 MG tablet; Take 1 tablet (100 mg total) by mouth 2 (two) times daily for 7 days.   Assessment and Plan     Cellulitis of lower limb Improving with doxycycline. No fever or drainage. Mild swelling and erythema persists - Prescribed an additional week of doxycycline. - Home wound care - Advised to return if symptoms worsen or do not improve.     Return to office for new or worsening symptoms, or if symptoms persist.   The patient indicates understanding of these issues and agrees with the plan.  Annabella CHRISTELLA Search, FNP
